# Patient Record
Sex: Female | Born: 1970 | Race: White | Hispanic: No | Smoking: Never smoker
Health system: Southern US, Community
[De-identification: ages and names within clinical notes are randomized; demographics above are authoritative.]

## PROBLEM LIST (undated history)

## (undated) DIAGNOSIS — D649 Anemia, unspecified: Secondary | ICD-10-CM

## (undated) DIAGNOSIS — N6310 Unspecified lump in the right breast, unspecified quadrant: Secondary | ICD-10-CM

## (undated) DIAGNOSIS — T7840XA Allergy, unspecified, initial encounter: Secondary | ICD-10-CM

## (undated) DIAGNOSIS — Z98811 Dental restoration status: Secondary | ICD-10-CM

## (undated) HISTORY — PX: BREAST BIOPSY: SHX20

## (undated) HISTORY — DX: Allergy, unspecified, initial encounter: T78.40XA

## (undated) HISTORY — PX: BREAST EXCISIONAL BIOPSY: SUR124

---

## 2004-06-20 ENCOUNTER — Other Ambulatory Visit: Admission: RE | Admit: 2004-06-20 | Discharge: 2004-06-20 | Payer: Self-pay | Admitting: Obstetrics and Gynecology

## 2006-11-22 ENCOUNTER — Inpatient Hospital Stay (HOSPITAL_COMMUNITY): Admission: AD | Admit: 2006-11-22 | Discharge: 2006-11-25 | Payer: Self-pay | Admitting: Obstetrics & Gynecology

## 2010-01-17 ENCOUNTER — Encounter: Admission: RE | Admit: 2010-01-17 | Discharge: 2010-01-17 | Payer: Self-pay | Admitting: Obstetrics & Gynecology

## 2010-03-31 ENCOUNTER — Encounter: Admission: RE | Admit: 2010-03-31 | Discharge: 2010-03-31 | Payer: Self-pay | Admitting: Obstetrics & Gynecology

## 2011-02-16 NOTE — Op Note (Signed)
NAME:  Cynthia Irwin, Cynthia Irwin NO.:  [id number]   MEDICAL RECORD NO.:  [id number]          PATIENT TYPE:  INP   LOCATION:  NA                            FACILITY:  [facility]   PHYSICIAN:  Gerrit Friends. Aldona Bar, M.D.   DATE OF BIRTH:  15-Aug-1971   DATE OF PROCEDURE:  11/22/2006  DATE OF DISCHARGE:                               OPERATIVE REPORT   PREOPERATIVE DIAGNOSIS:  Term pregnancy, previous cesarean section.   POSTOPERATIVE DIAGNOSES:  Term pregnancy, previous cesarean section,  plus delivery of viable female infant, Apgars 9 and 9, weight pending.   PROCEDURE:  Repeat low transverse cesarean section.   SURGEON:  Dr. Aldona Bar.   ASSISTANT:  Dr. Lodema Hong.   ANESTHESIA:  Subarachnoid block, Dr. Harvest Forest.   HISTORY:  This 40 year old gravida 3, para 1 has been followed by me in  my office during her pregnancy and done extremely well.  She now  presents at term for repeat cesarean section having had her first  cesarean section for failure to progress after going into spontaneous  labor and arresting at 5 cm.   The patient was taken to the operating room where a subarachnoid block  was carried out by Dr. Harvest Forest without difficulty.  Thereafter, the  patient was prepped and draped, having been placed in the supine  position, slightly tilted left, and a Foley catheter was placed as part  of the prep.   Once the patient was adequately draped and good anesthetic levels were  documented, procedure was begun.  A Pfannenstiel incision was made with  minimal difficulty, dissected down sharply to and through the fascia in  low transverse fashion with hemostasis created at each layer.  The  fascia was incised in a low transverse fashion.  Subfascial space was  created inferiorly and superiorly, muscles separated in the midline.  Peritoneum identified and entered appropriately with care taken to avoid  the bowel superiorly and the bladder inferiorly.  At this time, the  vesicouterine peritoneum  was identified, incised in a low transverse  fashion, and pushed off the lower uterine segment with ease.  Sharp  incision into the uterus in a low transverse fashion was then carried  out with Metzenbaum scissors.  Amniotomy produced - clear fluid, and the  incision was extended laterally.  Thereafter with the aid of the vacuum  extractor, a viable female infant which cried spontaneously at once was  delivered from the vertex position.  After the cord was clamped and cut,  the infant was passed off to the awaiting team.  Apgars were assigned at  9 and 9, and the weight is pending.   Placenta was delivered intact.  The patient was a cord blood donor.  At  this time, the uterus was exteriorized, rendered free of any remaining  products of conception, and closure of the uterus was then carried out  using a single layer of #1 Vicryl in a running locked fashion oversewn  by several figure-of-eight #1 Vicryls.  Good hemostasis was noted.  Tubes and ovaries appeared normal.  At this time, the uterus was  replaced in the abdominal cavity after the abdomen was lavaged of all  free blood and clot.  At this time, all counts were noted to be correct,  and no foreign bodies were noted to be remaining in the abdominal  cavity, and closure of the abdomen was carried out in layers.  The  abdominal peritoneum was closed with 0 Vicryl in a running fashion and  muscle secured with the same.  After assuring good vascular hemostasis,  the fascia was then reapproximated using 0 Vicryl from an angle in the  midline bilaterally.  Subcutaneous tissues were hemostatic.  The skin  was closed with staples.  Sterile pressure dressing was applied, and the  patient was transported to the recovery area in satisfactory condition,  having tolerated the procedure well.  Estimated blood loss 500 mL.  All  counts correct x2.   In summary, this patient underwent a repeat cesarean section at term  after a relatively benign  pregnancy and was delivered of a viable female  infant with Apgar of 9 and 9, weight pending.  At the conclusion of the  procedure, both mother and baby were doing well.      Gerrit Friends. Aldona Bar, M.D.  Electronically Signed     RMW/MEDQ  D:  11/22/2006  T:  11/22/2006  Job:  [id number]

## 2011-02-16 NOTE — Discharge Summary (Signed)
Cynthia Irwin, CLONCH NO.:  [id number]   MEDICAL RECORD NO.:  [id number]          PATIENT TYPE:  INP   LOCATION:  9118                          FACILITY:  [facility]   PHYSICIAN:  Gerrit Friends. Aldona Bar, M.D.   DATE OF BIRTH:  Jul 29, 1971   DATE OF ADMISSION:  11/22/2006  DATE OF DISCHARGE:  11/25/2006                               DISCHARGE SUMMARY   DISCHARGE DIAGNOSES:  1. Term pregnancy, delivered; 7 pound 9 ounce female infant, Apgars 9/9.  2. Previous cesarean section.  3. Blood type A+.   PROCEDURE:  Repeat low transverse cesarean section.  Surgeon - Dr. Aldona Bar.   SUMMARY:  This 40 year old gravida 3, para 1, was admitted at term for  an elective repeat cesarean section, having had her first baby by  cesarean section because of failure to progress.  Her pregnancy was  totally benign.  On the morning of November 22, 2006, she was taken to  the operating room, at which time she was delivered of a 7 pound 9 ounce  female infant with Apgars of 9/9 by repeat cesarean section.  Her  postoperative course was totally benign.  Discharge hemoglobin 9.3,  white count 8800, platelet count 132,000.  On the morning of November 25, 2006, she was ambulating well, tolerating a regular diet well,  having normal bowel and bladder function, was afebrile.  Her wound was  clean and dry.  Staples were removed, Steri-Strips left in place, and  she was given all appropriate instructions and understood all  instructions well and was discharged to home.   DISCHARGE MEDICATIONS:  1. Vitamins - one a day as long she is breast-feeding.  2. Feosol - one daily.  3. She will use Motrin 600 mg every 6 hours as needed for cramping or      pain.  4. Tylox 1-2 every 4-6 hours as needed for more severe pain.   FOLLOWUP:  She will return to the office for follow-up in approximately  3-4 weeks time or as needed.   CONDITION ON DISCHARGE:  Improved.      Gerrit Friends. Aldona Bar, M.D.  Electronically  Signed     RMW/MEDQ  D:  11/25/2006  T:  11/25/2006  Job:  [id number]

## 2012-04-15 ENCOUNTER — Other Ambulatory Visit: Payer: Self-pay | Admitting: Obstetrics & Gynecology

## 2013-08-10 ENCOUNTER — Other Ambulatory Visit: Payer: Self-pay | Admitting: Obstetrics & Gynecology

## 2013-08-18 ENCOUNTER — Other Ambulatory Visit: Payer: Self-pay | Admitting: Obstetrics & Gynecology

## 2013-08-18 DIAGNOSIS — R928 Other abnormal and inconclusive findings on diagnostic imaging of breast: Secondary | ICD-10-CM

## 2013-09-07 ENCOUNTER — Ambulatory Visit
Admission: RE | Admit: 2013-09-07 | Discharge: 2013-09-07 | Disposition: A | Discharge status: Home / Self Care | Payer: BC Managed Care – PPO | Source: Ambulatory Visit | Attending: Obstetrics & Gynecology | Admitting: Obstetrics & Gynecology

## 2013-09-07 ENCOUNTER — Other Ambulatory Visit: Payer: Self-pay | Admitting: Obstetrics & Gynecology

## 2013-09-07 DIAGNOSIS — R928 Other abnormal and inconclusive findings on diagnostic imaging of breast: Secondary | ICD-10-CM

## 2013-09-09 ENCOUNTER — Other Ambulatory Visit: Payer: Self-pay | Admitting: Obstetrics & Gynecology

## 2013-09-09 ENCOUNTER — Ambulatory Visit
Admission: RE | Admit: 2013-09-09 | Discharge: 2013-09-09 | Disposition: A | Discharge status: Home / Self Care | Payer: BC Managed Care – PPO | Source: Ambulatory Visit | Attending: Obstetrics & Gynecology | Admitting: Obstetrics & Gynecology

## 2013-09-09 ENCOUNTER — Other Ambulatory Visit (HOSPITAL_COMMUNITY): Payer: Self-pay | Admitting: Diagnostic Radiology

## 2013-09-09 DIAGNOSIS — R928 Other abnormal and inconclusive findings on diagnostic imaging of breast: Secondary | ICD-10-CM

## 2014-09-27 ENCOUNTER — Other Ambulatory Visit: Payer: Self-pay | Admitting: Obstetrics & Gynecology

## 2014-09-27 DIAGNOSIS — N644 Mastodynia: Secondary | ICD-10-CM

## 2014-09-28 LAB — CYTOLOGY - PAP

## 2014-10-19 ENCOUNTER — Other Ambulatory Visit: Payer: Self-pay | Admitting: Obstetrics & Gynecology

## 2014-10-19 ENCOUNTER — Ambulatory Visit
Admission: RE | Admit: 2014-10-19 | Discharge: 2014-10-19 | Disposition: A | Discharge status: Home / Self Care | Payer: BC Managed Care – PPO | Source: Ambulatory Visit | Attending: Obstetrics & Gynecology | Admitting: Obstetrics & Gynecology

## 2014-10-19 DIAGNOSIS — N644 Mastodynia: Secondary | ICD-10-CM

## 2014-10-26 ENCOUNTER — Other Ambulatory Visit: Payer: Self-pay | Admitting: Obstetrics & Gynecology

## 2014-10-26 DIAGNOSIS — N644 Mastodynia: Secondary | ICD-10-CM

## 2014-11-01 ENCOUNTER — Other Ambulatory Visit: Payer: BC Managed Care – PPO

## 2014-11-10 ENCOUNTER — Ambulatory Visit
Admission: RE | Admit: 2014-11-10 | Discharge: 2014-11-10 | Disposition: A | Discharge status: Home / Self Care | Payer: BC Managed Care – PPO | Source: Ambulatory Visit | Attending: Obstetrics & Gynecology | Admitting: Obstetrics & Gynecology

## 2014-11-10 DIAGNOSIS — N644 Mastodynia: Secondary | ICD-10-CM

## 2015-09-19 ENCOUNTER — Other Ambulatory Visit: Payer: Self-pay

## 2015-09-19 DIAGNOSIS — Z1231 Encounter for screening mammogram for malignant neoplasm of breast: Secondary | ICD-10-CM

## 2015-10-26 ENCOUNTER — Ambulatory Visit
Admission: RE | Admit: 2015-10-26 | Discharge: 2015-10-26 | Disposition: A | Discharge status: Home / Self Care | Payer: BC Managed Care – PPO | Source: Ambulatory Visit

## 2015-10-26 DIAGNOSIS — Z1231 Encounter for screening mammogram for malignant neoplasm of breast: Secondary | ICD-10-CM

## 2016-09-17 ENCOUNTER — Other Ambulatory Visit: Payer: Self-pay | Admitting: Obstetrics & Gynecology

## 2016-09-18 LAB — CYTOLOGY - PAP

## 2016-10-01 HISTORY — PX: BREAST LUMPECTOMY: SHX2

## 2016-11-09 ENCOUNTER — Other Ambulatory Visit: Payer: Self-pay | Admitting: Obstetrics & Gynecology

## 2016-11-09 DIAGNOSIS — Z1231 Encounter for screening mammogram for malignant neoplasm of breast: Secondary | ICD-10-CM

## 2016-11-23 ENCOUNTER — Ambulatory Visit
Admission: RE | Admit: 2016-11-23 | Discharge: 2016-11-23 | Disposition: A | Discharge status: Home / Self Care | Payer: BC Managed Care – PPO | Source: Ambulatory Visit | Attending: Obstetrics & Gynecology | Admitting: Obstetrics & Gynecology

## 2016-11-23 DIAGNOSIS — Z1231 Encounter for screening mammogram for malignant neoplasm of breast: Secondary | ICD-10-CM

## 2016-11-27 ENCOUNTER — Other Ambulatory Visit: Payer: Self-pay | Admitting: Obstetrics & Gynecology

## 2016-11-27 DIAGNOSIS — R928 Other abnormal and inconclusive findings on diagnostic imaging of breast: Secondary | ICD-10-CM

## 2016-12-07 ENCOUNTER — Other Ambulatory Visit: Payer: Self-pay | Admitting: Obstetrics & Gynecology

## 2016-12-07 ENCOUNTER — Ambulatory Visit
Admission: RE | Admit: 2016-12-07 | Discharge: 2016-12-07 | Disposition: A | Discharge status: Home / Self Care | Payer: BC Managed Care – PPO | Source: Ambulatory Visit | Attending: Obstetrics & Gynecology | Admitting: Obstetrics & Gynecology

## 2016-12-07 DIAGNOSIS — R928 Other abnormal and inconclusive findings on diagnostic imaging of breast: Secondary | ICD-10-CM

## 2016-12-07 DIAGNOSIS — N6310 Unspecified lump in the right breast, unspecified quadrant: Secondary | ICD-10-CM

## 2016-12-12 ENCOUNTER — Ambulatory Visit
Admission: RE | Admit: 2016-12-12 | Discharge: 2016-12-12 | Disposition: A | Discharge status: Home / Self Care | Payer: BC Managed Care – PPO | Source: Ambulatory Visit | Attending: Obstetrics & Gynecology | Admitting: Obstetrics & Gynecology

## 2016-12-12 ENCOUNTER — Other Ambulatory Visit: Payer: Self-pay | Admitting: Obstetrics & Gynecology

## 2016-12-12 DIAGNOSIS — N6310 Unspecified lump in the right breast, unspecified quadrant: Secondary | ICD-10-CM

## 2016-12-27 ENCOUNTER — Other Ambulatory Visit: Payer: Self-pay | Admitting: General Surgery

## 2016-12-27 DIAGNOSIS — N6310 Unspecified lump in the right breast, unspecified quadrant: Secondary | ICD-10-CM

## 2017-01-21 ENCOUNTER — Other Ambulatory Visit: Payer: Self-pay | Admitting: General Surgery

## 2017-01-21 DIAGNOSIS — N6310 Unspecified lump in the right breast, unspecified quadrant: Secondary | ICD-10-CM

## 2017-01-29 DIAGNOSIS — N6310 Unspecified lump in the right breast, unspecified quadrant: Secondary | ICD-10-CM

## 2017-01-29 HISTORY — DX: Unspecified lump in the right breast, unspecified quadrant: N63.10

## 2017-01-30 ENCOUNTER — Encounter (HOSPITAL_BASED_OUTPATIENT_CLINIC_OR_DEPARTMENT_OTHER): Payer: Self-pay | Admitting: *Deleted

## 2017-01-30 NOTE — Pre-Procedure Instructions (Signed)
To come pick up Boost Breeze - to drink by [phone]

## 2017-02-05 ENCOUNTER — Other Ambulatory Visit: Payer: Self-pay | Admitting: General Surgery

## 2017-02-05 ENCOUNTER — Ambulatory Visit
Admission: RE | Admit: 2017-02-05 | Discharge: 2017-02-05 | Disposition: A | Discharge status: Home / Self Care | Payer: BC Managed Care – PPO | Source: Ambulatory Visit | Attending: General Surgery | Admitting: General Surgery

## 2017-02-05 DIAGNOSIS — N6310 Unspecified lump in the right breast, unspecified quadrant: Secondary | ICD-10-CM

## 2017-02-05 NOTE — Progress Notes (Signed)
Boost drink given with instructions to complete by [facility], pt verbalized understanding.

## 2017-02-06 ENCOUNTER — Encounter (HOSPITAL_BASED_OUTPATIENT_CLINIC_OR_DEPARTMENT_OTHER)
Admission: RE | Disposition: A | Discharge status: Home / Self Care | Payer: Self-pay | Source: Ambulatory Visit | Attending: General Surgery

## 2017-02-06 ENCOUNTER — Ambulatory Visit (HOSPITAL_BASED_OUTPATIENT_CLINIC_OR_DEPARTMENT_OTHER)
Admission: RE | Admit: 2017-02-06 | Discharge: 2017-02-06 | Disposition: A | Discharge status: Home / Self Care | Payer: BC Managed Care – PPO | Source: Ambulatory Visit | Attending: General Surgery | Admitting: General Surgery

## 2017-02-06 ENCOUNTER — Encounter (HOSPITAL_BASED_OUTPATIENT_CLINIC_OR_DEPARTMENT_OTHER): Payer: Self-pay | Admitting: Anesthesiology

## 2017-02-06 ENCOUNTER — Ambulatory Visit (HOSPITAL_BASED_OUTPATIENT_CLINIC_OR_DEPARTMENT_OTHER): Payer: BC Managed Care – PPO | Admitting: Anesthesiology

## 2017-02-06 ENCOUNTER — Ambulatory Visit
Admission: RE | Admit: 2017-02-06 | Discharge: 2017-02-06 | Disposition: A | Discharge status: Home / Self Care | Payer: BC Managed Care – PPO | Source: Ambulatory Visit | Attending: General Surgery | Admitting: General Surgery

## 2017-02-06 DIAGNOSIS — N6091 Unspecified benign mammary dysplasia of right breast: Secondary | ICD-10-CM | POA: Diagnosis not present

## 2017-02-06 DIAGNOSIS — D241 Benign neoplasm of right breast: Secondary | ICD-10-CM | POA: Diagnosis not present

## 2017-02-06 DIAGNOSIS — Z79899 Other long term (current) drug therapy: Secondary | ICD-10-CM | POA: Diagnosis not present

## 2017-02-06 DIAGNOSIS — N6310 Unspecified lump in the right breast, unspecified quadrant: Secondary | ICD-10-CM

## 2017-02-06 HISTORY — DX: Anemia, unspecified: D64.9

## 2017-02-06 HISTORY — DX: Unspecified lump in the right breast, unspecified quadrant: N63.10

## 2017-02-06 HISTORY — PX: RADIOACTIVE SEED GUIDED EXCISIONAL BREAST BIOPSY: SHX6490

## 2017-02-06 HISTORY — DX: Dental restoration status: Z98.811

## 2017-02-06 LAB — POCT HEMOGLOBIN-HEMACUE: HEMOGLOBIN: 11.9 g/dL — AB (ref 12.0–15.0)

## 2017-02-06 SURGERY — RADIOACTIVE SEED GUIDED BREAST BIOPSY
Anesthesia: General | Site: Breast | Laterality: Right

## 2017-02-06 MED ORDER — ONDANSETRON HCL 4 MG/2ML IJ SOLN
INTRAMUSCULAR | Status: AC
  Filled 2017-02-06: qty 2

## 2017-02-06 MED ORDER — CEFAZOLIN SODIUM-DEXTROSE 2-4 GM/100ML-% IV SOLN
2.0000 g | INTRAVENOUS | Status: AC
  Administered 2017-02-06: 2 g via INTRAVENOUS

## 2017-02-06 MED ORDER — BUPIVACAINE HCL (PF) 0.25 % IJ SOLN
INTRAMUSCULAR | Status: DC | PRN
  Administered 2017-02-06: 10 mL

## 2017-02-06 MED ORDER — MIDAZOLAM HCL 5 MG/5ML IJ SOLN
INTRAMUSCULAR | Status: DC | PRN
  Administered 2017-02-06: 2 mg via INTRAVENOUS

## 2017-02-06 MED ORDER — ONDANSETRON HCL 4 MG/2ML IJ SOLN
INTRAMUSCULAR | Status: DC | PRN
  Administered 2017-02-06: 4 mg via INTRAVENOUS

## 2017-02-06 MED ORDER — MIDAZOLAM HCL 2 MG/2ML IJ SOLN: 1.0000 mg | INTRAMUSCULAR | Status: DC | PRN

## 2017-02-06 MED ORDER — ACETAMINOPHEN 500 MG PO TABS
1000.0000 mg | ORAL_TABLET | ORAL | Status: AC
  Administered 2017-02-06: 1000 mg via ORAL

## 2017-02-06 MED ORDER — HYDROMORPHONE HCL 1 MG/ML IJ SOLN: 0.2500 mg | INTRAMUSCULAR | Status: DC | PRN

## 2017-02-06 MED ORDER — MIDAZOLAM HCL 2 MG/2ML IJ SOLN
INTRAMUSCULAR | Status: AC
  Filled 2017-02-06: qty 2

## 2017-02-06 MED ORDER — CELECOXIB 200 MG PO CAPS
200.0000 mg | ORAL_CAPSULE | ORAL | Status: AC
  Administered 2017-02-06: 200 mg via ORAL

## 2017-02-06 MED ORDER — EPHEDRINE SULFATE 50 MG/ML IJ SOLN
INTRAMUSCULAR | Status: DC | PRN
  Administered 2017-02-06: 10 mg via INTRAVENOUS

## 2017-02-06 MED ORDER — PROPOFOL 10 MG/ML IV BOLUS
INTRAVENOUS | Status: DC | PRN
  Administered 2017-02-06: 150 mg via INTRAVENOUS

## 2017-02-06 MED ORDER — FENTANYL CITRATE (PF) 100 MCG/2ML IJ SOLN
INTRAMUSCULAR | Status: DC | PRN
  Administered 2017-02-06: 100 ug via INTRAVENOUS

## 2017-02-06 MED ORDER — SCOPOLAMINE 1 MG/3DAYS TD PT72: 1.0000 | MEDICATED_PATCH | Freq: Once | TRANSDERMAL | Status: DC | PRN

## 2017-02-06 MED ORDER — LIDOCAINE HCL (CARDIAC) 20 MG/ML IV SOLN
INTRAVENOUS | Status: DC | PRN
  Administered 2017-02-06: 30 mg via INTRAVENOUS

## 2017-02-06 MED ORDER — LIDOCAINE 2% (20 MG/ML) 5 ML SYRINGE
INTRAMUSCULAR | Status: AC
Start: 2017-02-06 — End: 2017-02-06
  Filled 2017-02-06: qty 5

## 2017-02-06 MED ORDER — DEXAMETHASONE SODIUM PHOSPHATE 10 MG/ML IJ SOLN
INTRAMUSCULAR | Status: AC
Start: 2017-02-06 — End: 2017-02-06
  Filled 2017-02-06: qty 1

## 2017-02-06 MED ORDER — GABAPENTIN 300 MG PO CAPS
ORAL_CAPSULE | ORAL | Status: AC
Start: 2017-02-06 — End: 2017-02-06
  Filled 2017-02-06: qty 1

## 2017-02-06 MED ORDER — ACETAMINOPHEN 500 MG PO TABS
ORAL_TABLET | ORAL | Status: AC
  Filled 2017-02-06: qty 2

## 2017-02-06 MED ORDER — CELECOXIB 200 MG PO CAPS
ORAL_CAPSULE | ORAL | Status: AC
  Filled 2017-02-06: qty 2

## 2017-02-06 MED ORDER — HYDROCODONE-ACETAMINOPHEN 5-325 MG PO TABS: 1.0000 | ORAL_TABLET | Freq: Four times a day (QID) | ORAL | 0 refills | Status: DC | PRN

## 2017-02-06 MED ORDER — DEXAMETHASONE SODIUM PHOSPHATE 4 MG/ML IJ SOLN
INTRAMUSCULAR | Status: DC | PRN
  Administered 2017-02-06: 10 mg via INTRAVENOUS

## 2017-02-06 MED ORDER — FENTANYL CITRATE (PF) 100 MCG/2ML IJ SOLN
INTRAMUSCULAR | Status: AC
  Filled 2017-02-06: qty 2

## 2017-02-06 MED ORDER — FENTANYL CITRATE (PF) 100 MCG/2ML IJ SOLN: 50.0000 ug | INTRAMUSCULAR | Status: DC | PRN

## 2017-02-06 MED ORDER — LACTATED RINGERS IV SOLN
INTRAVENOUS | Status: DC
  Administered 2017-02-06: 10:00:00 via INTRAVENOUS

## 2017-02-06 MED ORDER — PHENYLEPHRINE HCL 10 MG/ML IJ SOLN
INTRAMUSCULAR | Status: DC | PRN
  Administered 2017-02-06: 80 ug via INTRAVENOUS

## 2017-02-06 MED ORDER — CEFAZOLIN SODIUM-DEXTROSE 2-4 GM/100ML-% IV SOLN
INTRAVENOUS | Status: AC
  Filled 2017-02-06: qty 100

## 2017-02-06 MED ORDER — PROPOFOL 10 MG/ML IV BOLUS
INTRAVENOUS | Status: AC
  Filled 2017-02-06: qty 20

## 2017-02-06 MED ORDER — GABAPENTIN 300 MG PO CAPS
300.0000 mg | ORAL_CAPSULE | ORAL | Status: AC
  Administered 2017-02-06: 300 mg via ORAL

## 2017-02-06 SURGICAL SUPPLY — 58 items
ADH SKN CLS APL DERMABOND .7 (GAUZE/BANDAGES/DRESSINGS) ×1
BINDER BREAST LRG (GAUZE/BANDAGES/DRESSINGS) ×3 IMPLANT
BINDER BREAST MEDIUM (GAUZE/BANDAGES/DRESSINGS) IMPLANT
BINDER BREAST XLRG (GAUZE/BANDAGES/DRESSINGS) IMPLANT
BINDER BREAST XXLRG (GAUZE/BANDAGES/DRESSINGS) IMPLANT
BLADE SURG 15 STRL LF DISP TIS (BLADE) ×1 IMPLANT
BLADE SURG 15 STRL SS (BLADE) ×3
CANISTER SUC SOCK COL 7IN (MISCELLANEOUS) IMPLANT
CANISTER SUCT 1200ML W/VALVE (MISCELLANEOUS) IMPLANT
CHLORAPREP W/TINT 26ML (MISCELLANEOUS) ×3 IMPLANT
CLIP TI WIDE RED SMALL 6 (CLIP) ×3 IMPLANT
CLOSURE WOUND 1/2 X4 (GAUZE/BANDAGES/DRESSINGS) ×1
COVER BACK TABLE 60X90IN (DRAPES) ×3 IMPLANT
COVER MAYO STAND STRL (DRAPES) ×3 IMPLANT
COVER PROBE W GEL 5X96 (DRAPES) ×3 IMPLANT
DECANTER SPIKE VIAL GLASS SM (MISCELLANEOUS) IMPLANT
DERMABOND ADVANCED (GAUZE/BANDAGES/DRESSINGS) ×2
DERMABOND ADVANCED .7 DNX12 (GAUZE/BANDAGES/DRESSINGS) ×1 IMPLANT
DEVICE DUBIN W/COMP PLATE 8390 (MISCELLANEOUS) ×3 IMPLANT
DRAPE LAPAROSCOPIC ABDOMINAL (DRAPES) ×3 IMPLANT
DRAPE UTILITY XL STRL (DRAPES) ×3 IMPLANT
DRSG TEGADERM 4X4.75 (GAUZE/BANDAGES/DRESSINGS) IMPLANT
ELECT COATED BLADE 2.86 ST (ELECTRODE) ×3 IMPLANT
ELECT REM PT RETURN 9FT ADLT (ELECTROSURGICAL) ×3
ELECTRODE REM PT RTRN 9FT ADLT (ELECTROSURGICAL) ×1 IMPLANT
GAUZE SPONGE 4X4 12PLY STRL LF (GAUZE/BANDAGES/DRESSINGS) IMPLANT
GLOVE BIO SURGEON STRL SZ7 (GLOVE) ×6 IMPLANT
GLOVE BIOGEL PI IND STRL 7.5 (GLOVE) ×1 IMPLANT
GLOVE BIOGEL PI INDICATOR 7.5 (GLOVE) ×6
GLOVE SURG SYN 7.5  E (GLOVE) ×2
GLOVE SURG SYN 7.5 E (GLOVE) ×1 IMPLANT
GOWN STRL REUS W/ TWL LRG LVL3 (GOWN DISPOSABLE) ×2 IMPLANT
GOWN STRL REUS W/TWL LRG LVL3 (GOWN DISPOSABLE) ×6
HEMOSTAT ARISTA ABSORB 3G PWDR (MISCELLANEOUS) ×2 IMPLANT
ILLUMINATOR WAVEGUIDE N/F (MISCELLANEOUS) ×3 IMPLANT
KIT MARKER MARGIN INK (KITS) ×3 IMPLANT
LIGHT WAVEGUIDE WIDE FLAT (MISCELLANEOUS) IMPLANT
NEEDLE HYPO 25X1 1.5 SAFETY (NEEDLE) ×3 IMPLANT
NS IRRIG 1000ML POUR BTL (IV SOLUTION) IMPLANT
PACK BASIN DAY SURGERY FS (CUSTOM PROCEDURE TRAY) ×3 IMPLANT
PENCIL BUTTON HOLSTER BLD 10FT (ELECTRODE) ×3 IMPLANT
SLEEVE SCD COMPRESS KNEE MED (MISCELLANEOUS) ×3 IMPLANT
SPONGE LAP 4X18 X RAY DECT (DISPOSABLE) ×3 IMPLANT
STRIP CLOSURE SKIN 1/2X4 (GAUZE/BANDAGES/DRESSINGS) ×2 IMPLANT
SUT MNCRL AB 4-0 PS2 18 (SUTURE) IMPLANT
SUT MON AB 5-0 PS2 18 (SUTURE) ×3 IMPLANT
SUT SILK 2 0 SH (SUTURE) IMPLANT
SUT VIC AB 2-0 SH 27 (SUTURE) ×3
SUT VIC AB 2-0 SH 27XBRD (SUTURE) ×1 IMPLANT
SUT VIC AB 3-0 SH 27 (SUTURE) ×3
SUT VIC AB 3-0 SH 27X BRD (SUTURE) ×1 IMPLANT
SUT VIC AB 5-0 PS2 18 (SUTURE) IMPLANT
SYR CONTROL 10ML LL (SYRINGE) ×3 IMPLANT
TOWEL OR 17X24 6PK STRL BLUE (TOWEL DISPOSABLE) ×3 IMPLANT
TOWEL OR NON WOVEN STRL DISP B (DISPOSABLE) ×3 IMPLANT
TUBE CONNECTING 20'X1/4 (TUBING)
TUBE CONNECTING 20X1/4 (TUBING) IMPLANT
YANKAUER SUCT BULB TIP NO VENT (SUCTIONS) IMPLANT

## 2017-02-06 NOTE — Anesthesia Postprocedure Evaluation (Signed)
Anesthesia Post Note  Patient: Cynthia Irwin  Procedure(s) Performed: Procedure(s) (LRB): RADIOACTIVE SEED GUIDED EXCISIONAL BREAST BIOPSY (Right)  Patient location during evaluation: PACU Anesthesia Type: General Level of consciousness: awake and alert Pain management: pain level controlled Vital Signs Assessment: post-procedure vital signs reviewed and stable Respiratory status: spontaneous breathing, nonlabored ventilation and respiratory function stable Cardiovascular status: blood pressure returned to baseline and stable Postop Assessment: no signs of nausea or vomiting Anesthetic complications: no       Last Vitals:  Vitals:   02/06/17 1145 02/06/17 1200  BP: 121/66 114/70  Pulse: 81 72  Resp: (!) 27 15  Temp:      Last Pain:  Vitals:   02/06/17 1200  TempSrc:   PainSc: 2                  Abdirizak Richison,W. EDMOND

## 2017-02-06 NOTE — Interval H&P Note (Signed)
History and Physical Interval Note:  02/06/2017 9:55 AM  Cynthia Irwin  has presented today for surgery, with the diagnosis of RIGHT BREAST MASS  The various methods of treatment have been discussed with the patient and family. After consideration of risks, benefits and other options for treatment, the patient has consented to  Procedure(s): RADIOACTIVE SEED GUIDED EXCISIONAL BREAST BIOPSY (Right) as a surgical intervention .  The patient's history has been reviewed, patient examined, no change in status, stable for surgery.  I have reviewed the patient's chart and labs.  Questions were answered to the patient's satisfaction.     Marv Alfrey

## 2017-02-06 NOTE — Op Note (Signed)
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 Serita Grammes Anesthesia: Gen. 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 We discussed options and elected to proceed with seed guided excisional biopsy. She has seed placed prior to beginning and I had these mammograms in the OR  Procedure: After informed consent was obtained she was then taken to the operating room. She was given cefazolin. Sequential compression devices were on her legs. She was placed under general anesthesia without complication. Her rightbreast was then prepped and draped in the standard sterile surgical fashion. A surgical timeout was then performed.  I located the radioactive seed with the neoprobe. I infiltrated marcaine in the area of the seed. I made a far lateral incision to hide the scar.  I then used the neoprobe to guide the excision of the seed and surrounding tissue.This was confirmed by the neoprobe. This was then taken for mammogram which confirmed removal of the seed and the clip. The posterior extent is the muscle. 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 This was confirmed by radiology. This was then sent to pathology. Hemostasis was observed.I closed the breast tissue with a 2-0 Vicryl. The dermis was closed with 3-0 Vicryl and the skin with 4-0 Monocryl.Dermabond and steristrips were placed on the incision. A breast binder was placed. She was transferred to recovery stable

## 2017-02-06 NOTE — Discharge Instructions (Signed)
[facility] °Office Phone Number [phone] ° °POST OP INSTRUCTIONS ° °Always review your discharge instruction sheet given to you by the facility where your surgery was performed. ° °IF YOU HAVE DISABILITY OR FAMILY LEAVE FORMS, YOU MUST BRING THEM TO THE OFFICE FOR PROCESSING.  DO NOT GIVE THEM TO YOUR DOCTOR. ° °1. A prescription for pain medication may be given to you upon discharge.  Take your pain medication as prescribed, if needed.  If narcotic pain medicine is not needed, then you may take acetaminophen (Tylenol), naprosyn (Alleve) or ibuprofen (Advil) as needed. °2. Take your usually prescribed medications unless otherwise directed °3. If you need a refill on your pain medication, please contact your pharmacy.  They will contact our office to request authorization.  Prescriptions will not be filled after 5pm or on week-ends. °4. You should eat very light the first 24 hours after surgery, such as soup, crackers, pudding, etc.  Resume your normal diet the day after surgery. °5. Most patients will experience some swelling and bruising in the breast.  Ice packs and a good support bra will help.  Wear the breast binder provided or a sports bra for 72 hours day and night.  After that wear a sports bra during the day until you return to the office. Swelling and bruising can take several days to resolve.  °6. It is common to experience some constipation if taking pain medication after surgery.  Increasing fluid intake and taking a stool softener will usually help or prevent this problem from occurring.  A mild laxative (Milk of Magnesia or Miralax) should be taken according to package directions if there are no bowel movements after 48 hours. °7. Unless discharge instructions indicate otherwise, you may remove your bandages 48 hours after surgery and you may shower at that time.  You may have steri-strips (small skin tapes) in place directly over the incision.  These strips should be left on the  skin for 7-10 days and will come off on their own.  If your surgeon used skin glue on the incision, you may shower in 24 hours.  The glue will flake off over the next 2-3 weeks.  Any sutures or staples will be removed at the office during your follow-up visit. °8. ACTIVITIES:  You may resume regular daily activities (gradually increasing) beginning the next day.  Wearing a good support bra or sports bra minimizes pain and swelling.  You may have sexual intercourse when it is comfortable. °a. You may drive when you no longer are taking prescription pain medication, you can comfortably wear a seatbelt, and you can safely maneuver your car and apply brakes. °b. RETURN TO WORK:  ______________________________________________________________________________________ °9. You should see your doctor in the office for a follow-up appointment approximately two weeks after your surgery.  Your doctor’s nurse will typically make your follow-up appointment when she calls you with your pathology report.  Expect your pathology report 3-4 business days after your surgery.  You may call to check if you do not hear from us after three days. °10. OTHER INSTRUCTIONS: _______________________________________________________________________________________________ _____________________________________________________________________________________________________________________________________ °_____________________________________________________________________________________________________________________________________ °_____________________________________________________________________________________________________________________________________ ° °WHEN TO CALL DR WAKEFIELD: °1. Fever over 101.0 °2. Nausea and/or vomiting. °3. Extreme swelling or bruising. °4. Continued bleeding from incision. °5. Increased pain, redness, or drainage from the incision. ° °The clinic staff is available to answer your questions during regular  business hours.  Please don’t hesitate to call and ask to speak to one of the nurses for clinical concerns.  If   you have a medical emergency, go to the nearest emergency room or call 911.  A surgeon from [facility] is always on call at the hospital. ° °For further questions, please visit [email or web address] ° ° ° ° ° °Post Anesthesia Home Care Instructions ° °Activity: °Get plenty of rest for the remainder of the day. A responsible individual must stay with you for 24 hours following the procedure.  °For the next 24 hours, DO NOT: °-Drive a car °-Operate machinery °-Drink alcoholic beverages °-Take any medication unless instructed by your physician °-Make any legal decisions or sign important papers. ° °Meals: °Start with liquid foods such as gelatin or soup. Progress to regular foods as tolerated. Avoid greasy, spicy, heavy foods. If nausea and/or vomiting occur, drink only clear liquids until the nausea and/or vomiting subsides. Call your physician if vomiting continues. ° °Special Instructions/Symptoms: °Your throat may feel dry or sore from the anesthesia or the breathing tube placed in your throat during surgery. If this causes discomfort, gargle with warm salt water. The discomfort should disappear within 24 hours. ° °If you had a scopolamine patch placed behind your ear for the management of post- operative nausea and/or vomiting: ° °1. The medication in the patch is effective for 72 hours, after which it should be removed.  Wrap patch in a tissue and discard in the trash. Wash hands thoroughly with soap and water. °2. You may remove the patch earlier than 72 hours if you experience unpleasant side effects which may include dry mouth, dizziness or visual disturbances. °3. Avoid touching the patch. Wash your hands with soap and water after contact with the patch. °  ° °

## 2017-02-06 NOTE — Anesthesia Preprocedure Evaluation (Addendum)
Anesthesia Evaluation  Patient identified by MRN, date of birth, ID band Patient awake    Reviewed: Allergy & Precautions, H&P , NPO status , Patient's Chart, lab work & pertinent test results  Airway Mallampati: I  TM Distance: >3 FB Neck ROM: Full    Dental no notable dental hx. (+) Teeth Intact, Dental Advisory Given   Pulmonary neg pulmonary ROS,    Pulmonary exam normal breath sounds clear to auscultation       Cardiovascular negative cardio ROS   Rhythm:Regular Rate:Normal     Neuro/Psych negative neurological ROS  negative psych ROS   GI/Hepatic negative GI ROS, Neg liver ROS,   Endo/Other  negative endocrine ROS  Renal/GU negative Renal ROS  negative genitourinary   Musculoskeletal   Abdominal   Peds  Hematology negative hematology ROS (+)   Anesthesia Other Findings   Reproductive/Obstetrics negative OB ROS                            Anesthesia Physical Anesthesia Plan  ASA: I  Anesthesia Plan: General   Post-op Pain Management:    Induction: Intravenous  Airway Management Planned: LMA  Additional Equipment:   Intra-op Plan:   Post-operative Plan: Extubation in OR  Informed Consent: I have reviewed the patients History and Physical, chart, labs and discussed the procedure including the risks, benefits and alternatives for the proposed anesthesia with the patient or authorized representative who has indicated his/her understanding and acceptance.   Dental advisory given  Plan Discussed with: CRNA  Anesthesia Plan Comments:         Anesthesia Quick Evaluation

## 2017-02-06 NOTE — Transfer of Care (Signed)
Immediate Anesthesia Transfer of Care Note  Patient: Cynthia Irwin  Procedure(s) Performed: Procedure(s): RADIOACTIVE SEED GUIDED EXCISIONAL BREAST BIOPSY (Right)  Patient Location: PACU  Anesthesia Type:General  Level of Consciousness: awake  Airway & Oxygen Therapy: Patient Spontanous Breathing and Patient connected to face mask oxygen  Post-op Assessment: Report given to RN and Post -op Vital signs reviewed and stable  Post vital signs: Reviewed and stable  Last Vitals:  Vitals:   02/06/17 0923  BP: (!) 101/57  Pulse: 68  Resp: 18  Temp: 36.8 C    Last Pain:  Vitals:   02/06/17 0923  TempSrc: Oral         Complications: No apparent anesthesia complications

## 2017-02-06 NOTE — Progress Notes (Signed)
Patient ID: Cynthia Irwin, female   DOB: Mar 02, 1971, 46 y.o.   MRN: [id number] [place] reviewed day of surgery, postop pain rx given

## 2017-02-06 NOTE — H&P (Signed)
46 yof referred by Dr Fidela Salisbury for newly diagnosed right breast mass. no family history breast cancer she has personal history of multiple FA that underwent core biopsy. she underwent screening mm that shows right breast mass. on [place] there is a 1.7x1x1.5 cm mass with small superior projection. core biopsy is biphasic lesion cannot rule out low grade phyllodes tumor. she is here with her husband to discuss options  Past Surgical History Breast Biopsy  multiple Cesarean Section - Multiple   Diagnostic Studies History  Colonoscopy  never Mammogram  within last year Pap Smear  1-5 years ago  Allergies  No Known Allergies 12/27/2016  Medication History  Sertraline HCl (50MG  Tablet, Oral) Active. Medications Reconciled  Social History Alcohol use  Occasional alcohol use. Caffeine use  Coffee. Illicit drug use  Remotely quit drug use. Tobacco use  Never smoker.  Family History Heart Disease  Father. Heart disease in female family member before age 34  Heart disease in female family member before age 71  Hypertension  Father.  Pregnancy / Birth History Age at menarche  22 years. Contraceptive History  Oral contraceptives. Gravida  2 Length (months) of breastfeeding  12-24 Maternal age  15-30 Para  2 Regular periods   Other Problems  Back Pain  Lump In Breast    Review of Systems  General Not Present- Appetite Loss, Chills, Fatigue, Fever, Night Sweats, Weight Gain and Weight Loss. Skin Not Present- Change in Wart/Mole, Dryness, Hives, Jaundice, New Lesions, Non-Healing Wounds, Rash and Ulcer. HEENT Present- Seasonal Allergies and Wears glasses/contact lenses. Not Present- Earache, Hearing Loss, Hoarseness, Nose Bleed, Oral Ulcers, Ringing in the Ears, Sinus Pain, Sore Throat, Visual Disturbances and Yellow Eyes. Respiratory Not Present- Bloody sputum, Chronic Cough, Difficulty Breathing, Snoring and Wheezing. Breast Present- Breast Mass. Not Present-  Breast Pain, Nipple Discharge and Skin Changes. Cardiovascular Not Present- Chest Pain, Difficulty Breathing Lying Down, Leg Cramps, Palpitations, Rapid Heart Rate, Shortness of Breath and Swelling of Extremities. Gastrointestinal Not Present- Abdominal Pain, Bloating, Bloody Stool, Change in Bowel Habits, Chronic diarrhea, Constipation, Difficulty Swallowing, Excessive gas, Gets full quickly at meals, Hemorrhoids, Indigestion, Nausea, Rectal Pain and Vomiting. Female Genitourinary Not Present- Frequency, Nocturia, Painful Urination, Pelvic Pain and Urgency. Musculoskeletal Not Present- Back Pain, Joint Pain, Joint Stiffness, Muscle Pain, Muscle Weakness and Swelling of Extremities. Neurological Not Present- Decreased Memory, Fainting, Headaches, Numbness, Seizures, Tingling, Tremor, Trouble walking and Weakness. Psychiatric Not Present- Anxiety, Bipolar, Change in Sleep Pattern, Depression, Fearful and Frequent crying. Endocrine Not Present- Cold Intolerance, Excessive Hunger, Hair Changes, Heat Intolerance, Hot flashes and New Diabetes. Hematology Not Present- Blood Thinners, Easy Bruising, Excessive bleeding, Gland problems, HIV and Persistent Infections.  Vitals  Weight: 146.2 lb Height: 64in Body Surface Area: 1.71 m Body Mass Index: 25.09 kg/m  Temp.: 98.29F  Pulse: 67 (Regular)  BP: 116/72 (Sitting, Left Arm, Standard) Integumentary Global Assessment Examination of related systems reveals - Well-developed, well-nourished and in no acute distress; alert and oriented x 3. Eye Eyeball - Bilateral-Extraocular movements intact. Sclera/Conjunctiva - Bilateral-No scleral icterus. Chest and Lung Exam Chest and lung exam reveals -quiet, even and easy respiratory effort with no use of accessory muscles and on auscultation, normal breath sounds, no adventitious sounds and normal vocal resonance. Breast Nipples-No Discharge. Breast Lump-No Palpable Breast  Mass. Cardiovascular Cardiovascular examination reveals -on palpation PMI is normal in location and amplitude, no palpable S3 or S4. Normal cardiac borders. and normal heart sounds, regular rate and rhythm with no murmurs. Abdomen  Palpation/Percussion Palpation and Percussion of the abdomen reveal - Soft and Non Tender. Lymphatic Head & Neck General Head & Neck Lymphatics: Bilateral - Description - Normal. Axillary General Axillary Region: Bilateral - Description - Normal. Note: no Poston adenopathy   Assessment & Plan  BREAST MASS, RIGHT (N63.10) Story: Right breast seed guided excision we discussed observation vs excisional biopsy. I think with age and appearance reasonable to proceed with excision. we discussed seed guided excision and risks/recovery. 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

## 2017-02-06 NOTE — Anesthesia Procedure Notes (Signed)
Procedure Name: LMA Insertion Date/Time: 02/06/2017 10:13 AM Performed by: Toula Moos L Pre-anesthesia Checklist: Patient identified, Emergency Drugs available, Suction available, Patient being monitored and Timeout performed Patient Re-evaluated:Patient Re-evaluated prior to inductionOxygen Delivery Method: Circle system utilized Preoxygenation: Pre-oxygenation with 100% oxygen Intubation Type: IV induction Ventilation: Mask ventilation without difficulty LMA: LMA inserted LMA Size: 4.0 Number of attempts: 1 Airway Equipment and Method: Bite block Placement Confirmation: positive ETCO2 Tube secured with: Tape Dental Injury: Teeth and Oropharynx as per pre-operative assessment

## 2017-02-07 ENCOUNTER — Encounter (HOSPITAL_BASED_OUTPATIENT_CLINIC_OR_DEPARTMENT_OTHER): Payer: Self-pay | Admitting: General Surgery

## 2018-07-15 ENCOUNTER — Ambulatory Visit: Payer: BC Managed Care – PPO | Admitting: Psychiatry

## 2018-07-15 DIAGNOSIS — F321 Major depressive disorder, single episode, moderate: Secondary | ICD-10-CM | POA: Diagnosis not present

## 2018-07-15 NOTE — Progress Notes (Signed)
      [facility] Counselor/Therapist Progress Note   Patient ID: Cynthia Irwin, MRN: [id number]  Date: 07/15/2018  Timespent: 60 minutes  Treatment Type: Individual  Subjective: The client reports that she is having a midlife crisis.  "I feel this longing for heaven because there is so much brokeness and sadness in the world.  What am I my doing, where is my life going?  I just do not feel at peace."  As I discussed these thoughts with the client it became clear that she is just not satisfied in her life at 47 years of age.  I asked her what brings her joy?  The client had made a list on her phone.  It was things like hiking, kayaking, deep conversations and significant engagement with others.Teaching and speaking as well.  I asked the client to begin to think outside the box about what her next steps would be.  None of the things we talked about today needs to happen by the end of the week.  It would take time, it would take thought, it would take brainstorming with others.  The client thought about developing an outdoor recreation school that could be applied in a gap year.  She will look at other nonprofits that do similar things but in a less formalized way.  The client was very energized by this whole concept. The client also discussed her 4 year old daughter.  They are going out of town this coming weekend and her daughter wanted to stay home.  We talked about the concepts of privileges versus responsibilities and how comfortable the client was leaving her 38 year old daughter at home.  She was unsure how comfortable she was with that.  Some options we talked about was staying with another family or having 1 of the college students they know coming to be with her.  The client will consider that with her husband tonight.  Interventions:Solution Focused and Supportive  Mental Status Exam:   Appearance:   Well Groomed     Behavior:  Appropriate  Motor:  Normal  Speech/Language:   Clear  and Coherent  Affect:  Appropriate  Mood:  anxious and sad  Thought process:  Coherent  Thought content:    Logical  Perceptual disturbances:    Normal  Orientation:  Full (Time, Place, and Person)  Attention:  Good  Concentration:  good  Memory:  Immediate  Fund of knowledge:   Good  Insight:    Good  Judgment:   Good  Impulse Control:  good    Reported Symptoms: Anxiety, sadness  Risk Assessment: Danger to Self:  No Self-injurious Behavior: No Danger to Others: No Duty to Warn:no Physical Aggression / Violence:No  Access to Firearms a concern: No  Gang Involvement:No   Diagnosis:   ICD-10-CM   1. Major depressive disorder, single episode, moderate ([place]) F32.1      Plan: Journal, brainstorm with friends.  Albertina Parr [place], [place]

## 2018-10-15 ENCOUNTER — Ambulatory Visit (INDEPENDENT_AMBULATORY_CARE_PROVIDER_SITE_OTHER): Payer: BC Managed Care – PPO | Admitting: Psychiatry

## 2018-10-15 DIAGNOSIS — F321 Major depressive disorder, single episode, moderate: Secondary | ICD-10-CM

## 2018-10-16 ENCOUNTER — Encounter: Payer: Self-pay | Admitting: Psychiatry

## 2018-10-16 NOTE — Progress Notes (Signed)
      [facility] Counselor/Therapist Progress Note  Patient ID: Cynthia Irwin, MRN: [id number],    Date: 10/15/2018  Time Spent: 60 minutes   Treatment Type: Individual Therapy  Reported Symptoms: Depressed mood and Anxious Mood  Mental Status Exam:  Appearance:   Well Groomed     Behavior:  Appropriate  Motor:  Normal  Speech/Language:   Clear and Coherent  Affect:  Appropriate  Mood:  anxious, depressed and sad  Thought process:  normal  Thought content:    WNL  Sensory/Perceptual disturbances:    WNL  Orientation:  oriented to person, place, time/date and situation  Attention:  Good  Concentration:  Good  Memory:  WNL  Fund of knowledge:   Good  Insight:    Good  Judgment:   Good  Impulse Control:  Good   Risk Assessment: Danger to Self:  No Self-injurious Behavior: No Danger to Others: No Duty to Warn:no Physical Aggression / Violence:No  Access to Firearms a concern: No  Gang Involvement:No   Subjective: The client reports that her husband has been having small seizures.  They are happening almost every day.  It has been almost 10 years since he had a seizure.  At the time when they did a scan they discovered that he had a grade 3 tumor in his brain.  Tomorrow they are traveling to [facility] for another scan and hopefully receive the report on what is going on.  They made a just have to adjust his medications or he Cynthia Irwin have to have surgery again.  The client is very anxious and sad about this.  She states, "our whole marriage has been around the this disease."  She describes a lot of catastrophic thinking with her being a widow and having to raise her children on her own.  We discussed being mindful ins and staying in the present tense.  We also discussed radical acceptance. I used EMDR with the client to decrease her level of disturbance from a subjective units of distress of 8+ to less than 3 at the end of the session.  Her focus tomorrow will be to take  things as they come.  We discussed ways she could distract herself and focus on the moment.  The client agreed.  Interventions: Mindfulness Meditation, Solution-Oriented/Positive Psychology, [employer] Group Desensitization and Reprocessing (EMDR) and Insight-Oriented  Diagnosis:   ICD-10-CM   1. Major depressive disorder, single episode, moderate (HCC) F32.1     Plan: Mindfulness, distraction, radical acceptance.  Cynthia Irwin, [place]

## 2018-10-20 ENCOUNTER — Ambulatory Visit: Payer: BC Managed Care – PPO | Admitting: Psychiatry

## 2018-12-11 ENCOUNTER — Ambulatory Visit: Payer: BC Managed Care – PPO | Admitting: Psychiatry

## 2018-12-22 ENCOUNTER — Other Ambulatory Visit: Payer: Self-pay

## 2018-12-22 ENCOUNTER — Encounter: Payer: Self-pay | Admitting: Psychiatry

## 2018-12-22 ENCOUNTER — Ambulatory Visit (INDEPENDENT_AMBULATORY_CARE_PROVIDER_SITE_OTHER): Payer: BC Managed Care – PPO | Admitting: Psychiatry

## 2018-12-22 DIAGNOSIS — F4322 Adjustment disorder with anxiety: Secondary | ICD-10-CM

## 2018-12-22 NOTE — Progress Notes (Signed)
      [facility] Counselor/Therapist Progress Note  Patient ID: Cynthia Irwin, MRN: [id number],    Date: 12/22/2018  Time Spent: 70 minutes   Treatment Type: Individual Therapy  Reported Symptoms: anxiety.  Mental Status Exam:  Appearance:   Casual and Well Groomed     Behavior:  Appropriate  Motor:  Normal  Speech/Language:   Clear and Coherent  Affect:  Appropriate  Mood:  anxious  Thought process:  normal  Thought content:    WNL  Sensory/Perceptual disturbances:    WNL  Orientation:  oriented to person, place, time/date and situation  Attention:  Good  Concentration:  Good  Memory:  WNL  Fund of knowledge:   Good  Insight:    Good  Judgment:   Good  Impulse Control:  Good   Risk Assessment: Danger to Self:  No Self-injurious Behavior: No Danger to Others: No Duty to Warn:no Physical Aggression / Violence:No  Access to Firearms a concern: No  Gang Involvement:No   Subjective: The client reports that since last session her husband has gotten the results of his MRI.  It seems in the deep part of his brain he might of had a stroke.  In the spot where his previous tumor was there is something that they are unsure of what it is.  This coming Thursday they go back to [facility] for another scan to see if that spot has grown or not.  Then they will determine if he has to have surgery again. In addition to that the client's father-in-law had to have a benign mangioma removed from the back of his skull.  He whether the surgery well but developed a pulmonary embolism.  "That was difficult for everyone involved." The client is also worried about her husband's job as a Clinical biochemist at hospice.  He has a meeting tomorrow with his supervisor and someone from HR.  She is worried that he might lose his job.  This Wednesday is also her daughter's 17th birthday. Today the client went to use the EMDR hand paddles to do visualization with Jesus and these different circumstances.  The client was  able to successfully see that and found that she had great acceptance at what ever might be coming down the pike.  She finds herself less worried about the corona virus pandemic because that is "just 1 more thing out of my control."  The client subjective units of distress at the end was less than 2.  Interventions: Solution-Oriented/Positive Psychology, Eye Movement Desensitization and Reprocessing (EMDR) and Insight-Oriented  Diagnosis:   ICD-10-CM   1. Adjustment disorder with anxious mood F43.22     Plan: Positive self talk, prayer, self-care.  This record has been created using [employer].  Chart creation errors have been sought, but Tangelia Sanson not always have been located and corrected. Such creation errors do not reflect on the standard of medical care.   Kasen Sako, [place]

## 2019-04-01 ENCOUNTER — Other Ambulatory Visit: Payer: Self-pay

## 2019-04-01 ENCOUNTER — Ambulatory Visit
Admission: RE | Admit: 2019-04-01 | Discharge: 2019-04-01 | Disposition: A | Discharge status: Home / Self Care | Payer: BC Managed Care – PPO | Source: Ambulatory Visit | Attending: Physician Assistant | Admitting: Physician Assistant

## 2019-04-01 ENCOUNTER — Other Ambulatory Visit: Payer: Self-pay | Admitting: Physician Assistant

## 2019-04-01 DIAGNOSIS — Z1231 Encounter for screening mammogram for malignant neoplasm of breast: Secondary | ICD-10-CM

## 2019-04-10 ENCOUNTER — Encounter: Payer: Self-pay | Admitting: Psychiatry

## 2019-04-10 ENCOUNTER — Other Ambulatory Visit: Payer: Self-pay

## 2019-04-10 ENCOUNTER — Ambulatory Visit: Payer: BC Managed Care – PPO | Admitting: Psychiatry

## 2019-04-10 DIAGNOSIS — F321 Major depressive disorder, single episode, moderate: Secondary | ICD-10-CM | POA: Diagnosis not present

## 2019-04-10 NOTE — Progress Notes (Signed)
      [facility] Counselor/Therapist Progress Note  Patient ID: Cynthia Irwin, MRN: [id number],    Date: 04/10/2019  Time Spent: 65 minutes   Treatment Type: Individual Therapy  Reported Symptoms: grief, sadness, disappointment  Mental Status Exam:  Appearance:   Casual and Well Groomed     Behavior:  Appropriate  Motor:  Normal  Speech/Language:   Clear and Coherent  Affect:  Appropriate  Mood:  sad  Thought process:  normal  Thought content:    WNL  Sensory/Perceptual disturbances:    WNL  Orientation:  oriented to person, place, time/date and situation  Attention:  Good  Concentration:  Good  Memory:  WNL  Fund of knowledge:   Good  Insight:    Good  Judgment:   Good  Impulse Control:  Good   Risk Assessment: Danger to Self:  No Self-injurious Behavior: No Danger to Others: No Duty to Warn:no Physical Aggression / Violence:No  Access to Firearms a concern: No  Gang Involvement:No   Subjective: I met the client face to face.  We both had facemask's.  The client reports that her husband died of a brain bleed on 02-20-23 of this year.  He was at [facility] having a biopsy for the tumor that had appeared on the most recent MRI.  He was in the hospital for 5 or 6 days unresponsive before he died.  "It has been hard especially for my son."  The client has experienced both her husband's birthday and Father's Day since his death.  Her father-in-law has also been sick and grieving as well.  He had just had hemangioma removed and is fatigued and dizzy. The client states that she was overwhelmed by the amount of sympathy cards that she received about her husband.  She states, "even the people who work at the [employer] up in [place] sent sympathy cards.  It seemed he knew everyone."   The client is still navigating being alone.  She has been disappointed at the poor response from her church.  She states it has brought her daughter and her closer together. We discussed the  grief process with the client.  She is reading the book, "good grief".  She states she wants to be able to get outside and hike but does not want to do it alone.  The client will consider making a list of her female friends that would be open to hiking.  She can rotate those people so that she can always be hiking on the weekends or doing other outdoor activities she enjoys.  She will also hold off on making any big decisions for the next year.  At the next session we will make a plan about church, future job, her children, and what life is going to look like for her.    Interventions: Assertiveness/Communication, Motivational Interviewing, Solution-Oriented/Positive Psychology, Grief Therapy, Psycho-education/Bibliotherapy, Eye Movement Desensitization and Reprocessing (EMDR) and Insight-Oriented  Diagnosis:   ICD-10-CM   1. Major depressive disorder, single episode, moderate (HCC)  F32.1     Plan: self care, hike, boundaries, assertiveness.  This record has been created using [employer].  Chart creation errors have been sought, but Byanka Landrus not always have been located and corrected. Such creation errors do not reflect on the standard of medical care.  Maurianna Benard, [facility]

## 2019-04-24 ENCOUNTER — Ambulatory Visit (INDEPENDENT_AMBULATORY_CARE_PROVIDER_SITE_OTHER): Payer: BC Managed Care – PPO | Admitting: Psychiatry

## 2019-04-24 ENCOUNTER — Other Ambulatory Visit: Payer: Self-pay

## 2019-04-24 ENCOUNTER — Encounter: Payer: Self-pay | Admitting: Psychiatry

## 2019-04-24 DIAGNOSIS — F321 Major depressive disorder, single episode, moderate: Secondary | ICD-10-CM | POA: Diagnosis not present

## 2019-04-24 NOTE — Progress Notes (Signed)
    [facility] Counselor/Therapist Progress Note  Patient ID: Cynthia Irwin, MRN: [id number],    Date: 04/24/2019  Time Spent: 56 minutes   Treatment Type: Individual Therapy  Reported Symptoms: sad, grief, anxious.  Mental Status Exam:  Appearance:   Casual and Well Groomed     Behavior:  Appropriate  Motor:  Normal  Speech/Language:   Clear and Coherent  Affect:  Appropriate  Mood:  anxious, sad and grief  Thought process:  normal  Thought content:    WNL  Sensory/Perceptual disturbances:    WNL  Orientation:  oriented to person, place, time/date and situation  Attention:  Good  Concentration:  Good  Memory:  WNL  Fund of knowledge:   Good  Insight:    Good  Judgment:   Good  Impulse Control:  Good   Risk Assessment: Danger to Self:  No Self-injurious Behavior: No Danger to Others: No Duty to Warn:no Physical Aggression / Violence:No  Access to Firearms a concern: No  Gang Involvement:No   Subjective: I met with the client face-to-face.  We both had facemasks. The client's ankle was recently sprained.  "It slowed me down."  She continues to struggle with the death of her husband.  It is been 3 months and she states, "I cannot believe this is real."  She describes it not getting easier but only more difficult.  The life insurance that her husband had is taking a long time to pay out the money.  The insurance company wants to make sure that the death of the client's husband was not caused by his disease.  It does clearly state on the death certificate the cause of death as respiratory failure and a type of brain bleed.  This was caused by the biopsy and not the brain tumor. The client states she has spent literally hours on the phone with the insurance company.  They have not gotten records which the client has taken upon herself to get for the insurance company.  She continues to a bird dog the insurance process to make sure that all the bases are covered so that she  can have a good outcome.  She and her husband had decided to make a move (house) before he got sick to the school district for [employer].  She is moving forward with this but may be delayed because of the lack of the insurance money.  This frustrates her greatly. I used the bilateral stimulation hand paddles with the client to help reduce her sense of grief and sadness.  From 7 to 2 on the subjective units of distress scale.  She is slowly packing up her house but is hesitant to go through sentimental items belonging to her husband.  "I cry so much."  I encouraged the client to do some journaling by writing letters to her husband.  The client also has access to her husband's journals.  Some of the information is very personal.  I encouraged the client to go through and remove what she thinks are important bits to save for her children and grandchildren while redacting the very personal information.  She agreed.  Once her ankle heals she will start back to walking and hiking.  Interventions: Assertiveness/Communication, Motivational Interviewing, Solution-Oriented/Positive Psychology, Eye Movement Desensitization and Reprocessing (EMDR) and Insight-Oriented  Diagnosis:   ICD-10-CM   1. Major depressive disorder, single episode, moderate (HCC)  F32.1     Plan: Journaling letters to husband, saving the important parts   of his journal, self-care, social network.  This record has been created using [employer].  Chart creation errors have been sought, but Cynthia Irwin not always have been located and corrected. Such creation errors do not reflect on the standard of medical care.  This record has been created using [employer].  Chart creation errors have been sought, but Cynthia Irwin not always have been located and corrected. Such creation errors do not reflect on the standard of medical care.  Cynthia Irwin, [facility]

## 2019-04-30 ENCOUNTER — Ambulatory Visit: Payer: BC Managed Care – PPO | Admitting: Psychiatry

## 2019-05-07 ENCOUNTER — Other Ambulatory Visit: Payer: Self-pay

## 2019-05-07 ENCOUNTER — Ambulatory Visit (INDEPENDENT_AMBULATORY_CARE_PROVIDER_SITE_OTHER): Payer: BC Managed Care – PPO | Admitting: Psychiatry

## 2019-05-07 ENCOUNTER — Encounter: Payer: Self-pay | Admitting: Psychiatry

## 2019-05-07 DIAGNOSIS — F321 Major depressive disorder, single episode, moderate: Secondary | ICD-10-CM | POA: Diagnosis not present

## 2019-05-07 NOTE — Progress Notes (Signed)
      [facility] Counselor/Therapist Progress Note  Patient ID: TANISA LAGACE, MRN: [id number],    Date: 05/07/2019  Time Spent: 50 minutes   Treatment Type: Individual Therapy  Reported Symptoms: sadness, grief, teraful.  Mental Status Exam:  Appearance:   Casual and Well Groomed     Behavior:  Appropriate  Motor:  Normal  Speech/Language:   Clear and Coherent  Affect:  Tearful  Mood:  anxious and sad  Thought process:  normal  Thought content:    WNL  Sensory/Perceptual disturbances:    WNL  Orientation:  oriented to person, place, time/date and situation  Attention:  Good  Concentration:  Good  Memory:  WNL  Fund of knowledge:   Good  Insight:    Good  Judgment:   Good  Impulse Control:  Good   Risk Assessment: Danger to Self:  No Self-injurious Behavior: No Danger to Others: No Duty to Warn:no Physical Aggression / Violence:No  Access to Firearms a concern: No  Gang Involvement:No   Subjective: The client recently returned from a short vacation at [facility].  She had invited another family of friends down which made the time very pleasant.  "It was fine." Today the client states that things have been particularly hard here at the three-month mark.  "I miss my husband tremendously.  There is a huge void in her household."  She recently went through the garage which was his workspace with her in-laws.  "So much of it was about him." As the client discussed that she was very tearful processing her sadness and grief at the loss of her husband. The client is in the process of looking for a new house.  She and her husband had decided to do that before he died.  She states leaving the house will be like leaving part of him behind.  We discussed what she could do in her new house to remember her husband.  She found some rocks, which he like to collect, in the garage.  She and her son pick the best ones to put in the yard at the new house. The client has not had time to  journal.  She does realize that if her husband was to die at least he went quick and she and her 2 children could all be together.  No one was off to college or living somewhere else.  For that she is grateful. Client's ankle continues to heal slowly.  She is working on reaching out to friends and increasing her self-care.  I used the bilateral stimulation hand paddles with the client to help process her grief.  Her subjective units of distress went from a 7+ to less than 2 at the end of the session.  Interventions: Motivational Interviewing, Solution-Oriented/Positive Psychology, [employer] Group Desensitization and Reprocessing (EMDR) and Insight-Oriented  Diagnosis:   ICD-10-CM   1. Major depressive disorder, single episode, moderate ([place])  F32.1     Plan: Social network, grief work, self-care, exercise.  This record has been created using [employer].  Chart creation errors have been sought, but Jahaan Vanwagner not always have been located and corrected. Such creation errors do not reflect on the standard of medical care.  Alyscia Carmon, [facility]

## 2019-06-10 ENCOUNTER — Other Ambulatory Visit: Payer: Self-pay

## 2019-06-10 ENCOUNTER — Encounter: Payer: Self-pay | Admitting: Psychiatry

## 2019-06-10 ENCOUNTER — Ambulatory Visit (INDEPENDENT_AMBULATORY_CARE_PROVIDER_SITE_OTHER): Payer: BC Managed Care – PPO | Admitting: Psychiatry

## 2019-06-10 DIAGNOSIS — F4323 Adjustment disorder with mixed anxiety and depressed mood: Secondary | ICD-10-CM | POA: Diagnosis not present

## 2019-06-10 NOTE — Progress Notes (Signed)
[facility] Counselor/Therapist Progress Note  Patient ID: Cynthia Irwin, MRN: [id number],    Date: 06/10/2019  Time Spent: 50 minutes   Treatment Type: Individual Therapy  Reported Symptoms: sadness, anxious  Mental Status Exam:  Appearance:   Casual     Behavior:  Appropriate  Motor:  Normal  Speech/Language:   Clear and Coherent  Affect:  Appropriate  Mood:  anxious and sad  Thought process:  normal  Thought content:    WNL  Sensory/Perceptual disturbances:    WNL  Orientation:  oriented to person, place, time/date and situation  Attention:  Good  Concentration:  Good  Memory:  WNL  Fund of knowledge:   Good  Insight:    Good  Judgment:   Good  Impulse Control:  Good   Risk Assessment: Danger to Self:  No Self-injurious Behavior: No Danger to Others: No Duty to Warn:no Physical Aggression / Violence:No  Access to Firearms a concern: No  Gang Involvement:No   Subjective: The client states that she has been very stressed trying to adjust to her new school schedule with her children.  Since everything is online there has been more chaos than she is used to.  Her daughter is in the international baccalaureate program at [employer].  Her math class that she was taking was canceled and she had to take the same class over at [employer].  Unfortunately that class conflicts with another class she has to take.  The client is upset at the school system is not more accommodating. Her daughter has also been working on her college essay which is mainly about her father that recently died.  The client felt that her daughter did an excellent job.  It articulated what she learned from her father and how she could apply it to her future. The client is also realizing day by day how much her husband saw things differently than other people did.  She realized that he focused on the important things and ignored the things that did not matter.  "This had a huge impact on  [place] all."  The client has also recently bought a new house which is around the corner from the house her husband grew up in.  "He would be very pleased."  We discussed the difficulty of transitioning from their old house which has so many memories to their.  The client will ask friends to come over when they move to be there with her.  The client has also been following up with the grief share program at [facility].  She states she does not like to go but has found it helpful.  The most difficult thing has been the loneliness day today. At the next session we will focus on the nervousness she has about the move that is coming up in October.  She is working on radical acceptance with everything that is happening.  She especially needs it with the continued tussle with the insurance company over her husband's death.  They have not paid out the life insurance and are dragging their feet on it.  This frustrates the client to no end but she realizes she must accept it as that is even though she does not approve.    Interventions: Assertiveness/Communication, Solution-Oriented/Positive Psychology and Insight-Oriented  Diagnosis:   ICD-10-CM   1. Adjustment disorder with mixed anxiety and depressed mood  F43.23     Plan: Assertiveness, boundaries, radical acceptance, self-care, exercise.  Albertina Parr  Lucky Trotta, [facility]

## 2019-06-24 ENCOUNTER — Ambulatory Visit: Payer: BC Managed Care – PPO | Admitting: Psychiatry

## 2019-07-06 ENCOUNTER — Ambulatory Visit (INDEPENDENT_AMBULATORY_CARE_PROVIDER_SITE_OTHER): Payer: BC Managed Care – PPO | Admitting: Psychiatry

## 2019-07-06 ENCOUNTER — Encounter: Payer: Self-pay | Admitting: Psychiatry

## 2019-07-06 ENCOUNTER — Other Ambulatory Visit: Payer: Self-pay

## 2019-07-06 DIAGNOSIS — F4323 Adjustment disorder with mixed anxiety and depressed mood: Secondary | ICD-10-CM | POA: Diagnosis not present

## 2019-07-06 NOTE — Progress Notes (Signed)
      [facility] Counselor/Therapist Progress Note  Patient ID: Cynthia Irwin, MRN: [id number],    Date: 07/06/2019  Time Spent: 50 minutes   Treatment Type: Individual Therapy  Reported Symptoms: sad, anxious  Mental Status Exam:  Appearance:   Casual     Behavior:  Appropriate  Motor:  Normal  Speech/Language:   Clear and Coherent  Affect:  Appropriate  Mood:  anxious and sad  Thought process:  normal  Thought content:    WNL  Sensory/Perceptual disturbances:    WNL  Orientation:  oriented to person, place, time/date and situation  Attention:  Good  Concentration:  Good  Memory:  WNL  Fund of knowledge:   Good  Insight:    Good  Judgment:   Good  Impulse Control:  Good   Risk Assessment: Danger to Self:  No Self-injurious Behavior: No Danger to Others: No Duty to Warn:no Physical Aggression / Violence:No  Access to Firearms a concern: No  Gang Involvement:No   Subjective: The client states that she moved into her new house this past Friday. She states, "I am glad to be away from my old neighborhood ".  The neighbors at her previous house were difficult.  She states that it is a relief not to have to deal with them.  At her current house there are many people she knows in the neighborhood.  She was glad that her house sold as quickly as it did for $20,000 over the asking price.  "I know God is taking care of me". The client states that she was able to fill out her daughters FAFSA's form.  Two days later the insurance company called stating that she was approved for payment from her husband's life insurance policy.  It was consequential that it was 2 days after the FASFA was sent.  It did not impact their current income level for her daughter's college. She states she and her children feel settled in the house.  It seems like the "right" place.  She is considering what to do for Christmas.  This will be the first Christmas without her husband.  She had been advised to go  somewhere else.  Since she is not in their original house we discussed her staying there and making memories with her children and somehow including her deceased husband.  She thought this was a reasonable idea which she will consider. The client has been able to meet with friends.  Her foot has improved to the point where she can walk again.  She feels she is doing much better overall.  She still has episodes of sadness and some anxiety when she thinks of the future.   Interventions: Assertiveness/Communication, Motivational Interviewing, Solution-Oriented/Positive Psychology and Insight-Oriented  Diagnosis:   ICD-10-CM   1. Adjustment disorder with mixed anxiety and depressed mood  F43.23     Plan: Boundaries, assertiveness, self-care, social network, grief work around her husband's death.  Retta Pitcher, [facility]

## 2019-07-29 ENCOUNTER — Other Ambulatory Visit: Payer: Self-pay

## 2019-07-29 DIAGNOSIS — Z20822 Contact with and (suspected) exposure to covid-19: Secondary | ICD-10-CM

## 2019-07-30 LAB — NOVEL CORONAVIRUS, NAA: SARS-CoV-2, NAA: NOT DETECTED

## 2019-08-17 ENCOUNTER — Ambulatory Visit: Payer: BC Managed Care – PPO | Admitting: Psychiatry

## 2019-11-23 ENCOUNTER — Encounter: Payer: Self-pay | Admitting: Internal Medicine

## 2019-12-14 ENCOUNTER — Other Ambulatory Visit: Payer: Self-pay | Admitting: General Surgery

## 2019-12-14 DIAGNOSIS — D179 Benign lipomatous neoplasm, unspecified: Secondary | ICD-10-CM

## 2019-12-22 ENCOUNTER — Other Ambulatory Visit: Payer: Self-pay

## 2019-12-22 ENCOUNTER — Ambulatory Visit (AMBULATORY_SURGERY_CENTER): Payer: Self-pay

## 2019-12-22 VITALS — Temp 97.6°F | Ht 64.0 in | Wt 164.0 lb

## 2019-12-22 DIAGNOSIS — Z01818 Encounter for other preprocedural examination: Secondary | ICD-10-CM

## 2019-12-22 DIAGNOSIS — Z1211 Encounter for screening for malignant neoplasm of colon: Secondary | ICD-10-CM

## 2019-12-22 MED ORDER — NA SULFATE-K SULFATE-MG SULF 17.5-3.13-1.6 GM/177ML PO SOLN: 1.0000 | Freq: Once | ORAL | 0 refills | Status: AC

## 2019-12-22 NOTE — Progress Notes (Signed)
No egg or soy allergy known to patient  No issues with past sedation with any surgeries  or procedures, no intubation problems  No diet pills per patient No home 02 use per patient  No blood thinners per patient  Pt denies issues with constipation  No A fib or A flutter  EMMI video sent to pt's e mail   Pt is recent widow, husband died from brain cancer 2019/01/21.  suprep coupon and code given  Due to the COVID-19 pandemic we are asking patients to follow these guidelines. Please only bring one care partner. Please be aware that your care partner may wait in the car in the parking lot or if they feel like they will be too hot to wait in the car, they may wait in the lobby on the 4th floor. All care partners are required to wear a mask the entire time (we do not have any that we can provide them), they need to practice social distancing, and we will do a Covid check for all patient's and care partners when you arrive. Also we will check their temperature and your temperature. If the care partner waits in their car they need to stay in the parking lot the entire time and we will call them on their cell phone when the patient is ready for discharge so they can bring the car to the front of the building. Also all patient's will need to wear a mask into building.

## 2019-12-31 ENCOUNTER — Ambulatory Visit: Payer: 59 | Attending: Internal Medicine

## 2019-12-31 DIAGNOSIS — Z23 Encounter for immunization: Secondary | ICD-10-CM

## 2019-12-31 NOTE — Progress Notes (Signed)
   Covid-19 Vaccination Clinic  Name:  Cynthia Irwin    MRN: [id number] DOB: 1971-03-16  12/31/2019  [name] was observed post Covid-19 immunization for 15 minutes without incident. She was provided with Vaccine Information Sheet and instruction to access the V-Safe system.   Ms. Bridge was instructed to call 911 with any severe reactions post vaccine: Marland Kitchen Difficulty breathing  . Swelling of face and throat  . A fast heartbeat  . A bad rash all over body  . Dizziness and weakness   Immunizations Administered    Name Date Dose VIS Date Route   Pfizer COVID-19 Vaccine 12/31/2019  3:33 PM 0.3 mL 09/11/2019 Intramuscular   Manufacturer: [place]   Lot: [id number]   [place]: [id number]

## 2020-01-04 ENCOUNTER — Ambulatory Visit
Admission: RE | Admit: 2020-01-04 | Discharge: 2020-01-04 | Disposition: A | Discharge status: Home / Self Care | Payer: BC Managed Care – PPO | Source: Ambulatory Visit | Attending: General Surgery | Admitting: General Surgery

## 2020-01-04 DIAGNOSIS — D179 Benign lipomatous neoplasm, unspecified: Secondary | ICD-10-CM

## 2020-01-04 MED ORDER — IOPAMIDOL (ISOVUE-300) INJECTION 61%
100.0000 mL | Freq: Once | INTRAVENOUS | Status: AC | PRN
  Administered 2020-01-04: 100 mL via INTRAVENOUS

## 2020-01-05 ENCOUNTER — Encounter: Payer: BC Managed Care – PPO | Admitting: Internal Medicine

## 2020-01-11 ENCOUNTER — Other Ambulatory Visit: Payer: Self-pay | Admitting: General Surgery

## 2020-01-14 ENCOUNTER — Ambulatory Visit (INDEPENDENT_AMBULATORY_CARE_PROVIDER_SITE_OTHER): Payer: 59

## 2020-01-14 ENCOUNTER — Other Ambulatory Visit: Payer: Self-pay | Admitting: Internal Medicine

## 2020-01-14 DIAGNOSIS — Z1159 Encounter for screening for other viral diseases: Secondary | ICD-10-CM

## 2020-01-14 LAB — SARS CORONAVIRUS 2 (TAT 6-24 HRS): SARS Coronavirus 2: NEGATIVE

## 2020-01-19 ENCOUNTER — Other Ambulatory Visit: Payer: Self-pay

## 2020-01-19 ENCOUNTER — Ambulatory Visit (AMBULATORY_SURGERY_CENTER): Payer: 59 | Admitting: Internal Medicine

## 2020-01-19 ENCOUNTER — Encounter: Payer: Self-pay | Admitting: Internal Medicine

## 2020-01-19 VITALS — BP 105/72 | HR 74 | Temp 97.5°F | Resp 20 | Ht 64.0 in | Wt 164.0 lb

## 2020-01-19 DIAGNOSIS — Z1211 Encounter for screening for malignant neoplasm of colon: Secondary | ICD-10-CM | POA: Diagnosis not present

## 2020-01-19 MED ORDER — SODIUM CHLORIDE 0.9 % IV SOLN: 500.0000 mL | Freq: Once | INTRAVENOUS | Status: DC

## 2020-01-19 NOTE — Op Note (Signed)
[facility] Patient Name: Cynthia Irwin Procedure Date: 01/19/2020 2:39 PM MRN: [id number] Endoscopist: Jerene Bears , MD Age: 49 Referring MD:  Date of Birth: 12/05/1970 Gender: Female Account #: [id number] Procedure:                Colonoscopy Indications:              Screening for colorectal malignant neoplasm, This                            is the patient's first colonoscopy Medicines:                Monitored Anesthesia Care Procedure:                Pre-Anesthesia Assessment:                           - Prior to the procedure, a History and Physical                            was performed, and patient medications and                            allergies were reviewed. The patient's tolerance of                            previous anesthesia was also reviewed. The risks                            and benefits of the procedure and the sedation                            options and risks were discussed with the patient.                            All questions were answered, and informed consent                            was obtained. Prior Anticoagulants: The patient has                            taken no previous anticoagulant or antiplatelet                            agents. ASA Grade Assessment: II - A patient with                            mild systemic disease. After reviewing the risks                            and benefits, the patient was deemed in                            satisfactory condition to undergo the procedure.  After obtaining informed consent, the colonoscope                            was passed under direct vision. Throughout the                            procedure, the patient's blood pressure, pulse, and                            oxygen saturations were monitored continuously. The                            Colonoscope was introduced through the anus and                            advanced to the cecum,  identified by appendiceal                            orifice and ileocecal valve. The colonoscopy was                            performed without difficulty. The patient tolerated                            the procedure well. The quality of the bowel                            preparation was good. The ileocecal valve,                            appendiceal orifice, and rectum were photographed. Scope In: 2:47:03 PM Scope Out: 3:02:13 PM Scope Withdrawal Time: 0 hours 12 minutes 6 seconds  Total Procedure Duration: 0 hours 15 minutes 10 seconds  Findings:                 The digital rectal exam was normal.                           The entire examined colon appeared normal on direct                            and retroflexion views. Complications:            No immediate complications. Estimated Blood Loss:     Estimated blood loss: none. Impression:               - The entire examined colon is normal on direct and                            retroflexion views.                           - No specimens collected. Recommendation:           - Patient has a contact number available for  emergencies. The signs and symptoms of potential                            delayed complications were discussed with the                            patient. Return to normal activities tomorrow.                            Written discharge instructions were provided to the                            patient.                           - Resume previous diet.                           - Continue present medications.                           - Repeat colonoscopy in 10 years for screening                            purposes. Jerene Bears, MD 01/19/2020 3:03:54 PM This report has been signed electronically.

## 2020-01-19 NOTE — Progress Notes (Signed)
Report given to PACU, vss 

## 2020-01-19 NOTE — Patient Instructions (Signed)
Discharge instructions given. Normal exam. Resume previous medications. YOU HAD AN ENDOSCOPIC PROCEDURE TODAY AT THE [facility]:   Refer to the procedure report that was given to you for any specific questions about what was found during the examination.  If the procedure report does not answer your questions, please call your gastroenterologist to clarify.  If you requested that your care partner not be given the details of your procedure findings, then the procedure report has been included in a sealed envelope for you to review at your convenience later.  YOU SHOULD EXPECT: Some feelings of bloating in the abdomen. Passage of more gas than usual.  Walking can help get rid of the air that was put into your GI tract during the procedure and reduce the bloating. If you had a lower endoscopy (such as a colonoscopy or flexible sigmoidoscopy) you may notice spotting of blood in your stool or on the toilet paper. If you underwent a bowel prep for your procedure, you may not have a normal bowel movement for a few days.  Please Note:  You might notice some irritation and congestion in your nose or some drainage.  This is from the oxygen used during your procedure.  There is no need for concern and it should clear up in a day or so.  SYMPTOMS TO REPORT IMMEDIATELY:  Following lower endoscopy (colonoscopy or flexible sigmoidoscopy):  Excessive amounts of blood in the stool  Significant tenderness or worsening of abdominal pains  Swelling of the abdomen that is new, acute  Fever of 100F or higher   For urgent or emergent issues, a gastroenterologist can be reached at any hour by calling [phone]. Do not use MyChart messaging for urgent concerns.    DIET:  We do recommend a small meal at first, but then you may proceed to your regular diet.  Drink plenty of fluids but you should avoid alcoholic beverages for 24 hours.  ACTIVITY:  You should plan to take it easy for the rest of  today and you should NOT DRIVE or use heavy machinery until tomorrow (because of the sedation medicines used during the test).    FOLLOW UP: Our staff will call the number listed on your records 48-72 hours following your procedure to check on you and address any questions or concerns that you may have regarding the information given to you following your procedure. If we do not reach you, we will leave a message.  We will attempt to reach you two times.  During this call, we will ask if you have developed any symptoms of COVID 19. If you develop any symptoms (ie: fever, flu-like symptoms, shortness of breath, cough etc.) before then, please call [phone].  If you test positive for Covid 19 in the 2 weeks post procedure, please call and report this information to us.    If any biopsies were taken you will be contacted by phone or by letter within the next 1-3 weeks.  Please call us at [phone] if you have not heard about the biopsies in 3 weeks.    SIGNATURES/CONFIDENTIALITY: You and/or your care partner have signed paperwork which will be entered into your electronic medical record.  These signatures attest to the fact that that the information above on your After Visit Summary has been reviewed and is understood.  Full responsibility of the confidentiality of this discharge information lies with you and/or your care-partner.  

## 2020-01-19 NOTE — Progress Notes (Signed)
Temp by JB Vitals by CW  Pt's states no medical or surgical changes since previsit or office visit.  

## 2020-01-21 ENCOUNTER — Telehealth: Payer: Self-pay | Admitting: *Deleted

## 2020-01-21 NOTE — Telephone Encounter (Signed)
  Follow up Call-  Call back number 01/19/2020  Post procedure Call Back phone  # [phone]  Permission to leave phone message Yes  Some recent data might be hidden     Patient questions:  Do you have a fever, pain , or abdominal swelling? No. Pain Score  0 *  Have you tolerated food without any problems? Yes.    Have you been able to return to your normal activities? Yes.    Do you have any questions about your discharge instructions: Diet   No. Medications  No. Follow up visit  No.  Do you have questions or concerns about your Care? No.  Actions: * If pain score is 4 or above: No action needed, pain <4.  1. Have you developed a fever since your procedure? no  2.   Have you had an respiratory symptoms (SOB or cough) since your procedure? no  3.   Have you tested positive for COVID 19 since your procedure no  4.   Have you had any family members/close contacts diagnosed with the COVID 19 since your procedure?  no   If yes to any of these questions please route to Joylene John, RN and Erenest Rasher, RN

## 2020-01-25 ENCOUNTER — Ambulatory Visit: Payer: 59 | Attending: Internal Medicine

## 2020-01-25 DIAGNOSIS — Z23 Encounter for immunization: Secondary | ICD-10-CM

## 2020-01-25 NOTE — Progress Notes (Signed)
   Covid-19 Vaccination Clinic  Name:  CARROLYN HIMELRIGHT    MRN: [id number] DOB: 04/19/1971  01/25/2020  Ms. Shugarts was observed post Covid-19 immunization for 15 minutes without incident. She was provided with Vaccine Information Sheet and instruction to access the V-Safe system.   Ms. Holgerson was instructed to call 911 with any severe reactions post vaccine: Marland Kitchen Difficulty breathing  . Swelling of face and throat  . A fast heartbeat  . A bad rash all over body  . Dizziness and weakness   Immunizations Administered    Name Date Dose VIS Date Route   Pfizer COVID-19 Vaccine 01/25/2020 10:47 AM 0.3 mL 11/25/2018 Intramuscular   Manufacturer: [place]   Lot: [id number]   [place]: [id number]

## 2020-02-18 NOTE — Progress Notes (Signed)
[facility] [place], [place] AT [facility] OF [address] [place] [address] Phone: [phone] Fax: [phone]      Your procedure is scheduled on Feb 23, 2020.  Report to [facility] Main Entrance "A" at 05:30 A.M., and check in at the Admitting office.  Call this number if you have problems the morning of surgery:  [phone]  Call [phone] if you have any questions prior to your surgery date Monday-Friday 8am-4pm    Remember:  Do not eat after midnight the night before your surgery  You may drink clear liquids until 04:30am the morning of your surgery.   Clear liquids allowed are: Water, Non-Citrus Juices (without pulp), Carbonated Beverages, Clear Tea, Black Coffee Only, and Gatorade.  **Please complete your PRE-SURGERY ENSURE that was provided by 4:30am the morning of surgery.  Please, if able, drink it in one setting. DO NOT SIP.**    Take these medicines the morning of surgery with A SIP OF WATER : Loratadine (Claritin) Sertraline (Zoloft)  As of today, STOP taking any Aspirin (unless otherwise instructed by your surgeon) and Aspirin containing products, Aleve, Naproxen, Ibuprofen, Motrin, Advil, Goody's, BC's, all herbal medications, fish oil, and all vitamins.                      Do not wear jewelry, make up, or nail polish            Do not wear lotions, powders, perfumes/colognes, or deodorant.            Do not shave 48 hours prior to surgery.             Do not bring valuables to the hospital.            [facility] is not responsible for any belongings or valuables.  Do NOT Smoke (Tobacco/Vapping) or drink Alcohol 24 hours prior to your procedure If you use a CPAP at night, you may bring all equipment for your overnight stay.   Contacts, glasses, dentures or bridgework may not be worn into surgery.      For patients admitted to the hospital, discharge time will be determined by your treatment  team.   Patients discharged the day of surgery will not be allowed to drive home, and someone needs to stay with them for 24 hours.    Special instructions:   [facility]- Preparing For Surgery  Before surgery, you can play an important role. Because skin is not sterile, your skin needs to be as free of germs as possible. You can reduce the number of germs on your skin by washing with CHG (chlorahexidine gluconate) Soap before surgery.  CHG is an antiseptic cleaner which kills germs and bonds with the skin to continue killing germs even after washing.    Oral Hygiene is also important to reduce your risk of infection.  Remember - BRUSH YOUR TEETH THE MORNING OF SURGERY WITH YOUR REGULAR TOOTHPASTE  Please do not use if you have an allergy to CHG or antibacterial soaps. If your skin becomes reddened/irritated stop using the CHG.  Do not shave (including legs and underarms) for at least 48 hours prior to first CHG shower. It is OK to shave your face.  Please follow these instructions carefully.   1. Shower the NIGHT BEFORE SURGERY and the MORNING OF SURGERY with CHG Soap.   2. If you chose to wash  your hair, wash your hair first as usual with your normal shampoo.  3. After you shampoo, rinse your hair and body thoroughly to remove the shampoo.  4. Use CHG as you would any other liquid soap. You can apply CHG directly to the skin and wash gently with a scrungie or a clean washcloth.   5. Apply the CHG Soap to your body ONLY FROM THE NECK DOWN.  Do not use on open wounds or open sores. Avoid contact with your eyes, ears, mouth and genitals (private parts). Wash Face and genitals (private parts)  with your normal soap.   6. Wash thoroughly, paying special attention to the area where your surgery will be performed.  7. Thoroughly rinse your body with warm water from the neck down.  8. DO NOT shower/wash with your normal soap after using and rinsing off the CHG Soap.  9. Pat yourself dry  with a CLEAN TOWEL.  10. Wear CLEAN PAJAMAS to bed the night before surgery, wear comfortable clothes the morning of surgery  11. Place CLEAN SHEETS on your bed the night of your first shower and DO NOT SLEEP WITH PETS.   Day of Surgery:   Do not apply any deodorants/lotions.  Please wear clean clothes to the hospital/surgery center.   Remember to brush your teeth WITH YOUR REGULAR TOOTHPASTE.   Please read over the following fact sheets that you were given.

## 2020-02-19 ENCOUNTER — Other Ambulatory Visit (HOSPITAL_COMMUNITY)
Admission: RE | Admit: 2020-02-19 | Discharge: 2020-02-19 | Disposition: A | Discharge status: Home / Self Care | Payer: 59 | Source: Ambulatory Visit | Attending: General Surgery | Admitting: General Surgery

## 2020-02-19 ENCOUNTER — Encounter (HOSPITAL_COMMUNITY): Payer: Self-pay

## 2020-02-19 ENCOUNTER — Encounter (HOSPITAL_COMMUNITY)
Admission: RE | Admit: 2020-02-19 | Discharge: 2020-02-19 | Disposition: A | Discharge status: Home / Self Care | Payer: 59 | Source: Ambulatory Visit | Attending: General Surgery | Admitting: General Surgery

## 2020-02-19 ENCOUNTER — Other Ambulatory Visit: Payer: Self-pay

## 2020-02-19 DIAGNOSIS — Z20822 Contact with and (suspected) exposure to covid-19: Secondary | ICD-10-CM | POA: Insufficient documentation

## 2020-02-19 DIAGNOSIS — Z01812 Encounter for preprocedural laboratory examination: Secondary | ICD-10-CM | POA: Insufficient documentation

## 2020-02-19 LAB — CBC
HCT: 39.7 % (ref 36.0–46.0)
Hemoglobin: 12.3 g/dL (ref 12.0–15.0)
MCH: 26.8 pg (ref 26.0–34.0)
MCHC: 31 g/dL (ref 30.0–36.0)
MCV: 86.5 fL (ref 80.0–100.0)
Platelets: 269 10*3/uL (ref 150–400)
RBC: 4.59 MIL/uL (ref 3.87–5.11)
RDW: 15.7 % — ABNORMAL HIGH (ref 11.5–15.5)
WBC: 6.6 10*3/uL (ref 4.0–10.5)
nRBC: 0 % (ref 0.0–0.2)

## 2020-02-19 LAB — SARS CORONAVIRUS 2 (TAT 6-24 HRS): SARS Coronavirus 2: NEGATIVE

## 2020-02-19 NOTE — Progress Notes (Signed)
PCP - Roe Coombs, PA Cardiologist - patient denies  PPM/ICD - n/a Device Orders -  Rep Notified -   Chest x-ray - n/a EKG - n/a Stress Test - patient denies ECHO - patient denies Cardiac Cath - patient denies  Sleep Study - patient denies CPAP -   Fasting Blood Sugar - n/a Checks Blood Sugar _____ times a day  Blood Thinner Instructions: n/a Aspirin Instructions: n/a  ERAS Protcol - clears until 0430 PRE-SURGERY Ensure or G2- Ensure ordered, patient did not want.  COVID TEST- 02/19/20 prior to PAT appointment   Anesthesia review: n/a  Patient denies shortness of breath, fever, cough and chest pain at PAT appointment   All instructions explained to the patient, with a verbal understanding of the material. Patient agrees to go over the instructions while at home for a better understanding. Patient also instructed to self quarantine after being tested for COVID-19. The opportunity to ask questions was provided.

## 2020-02-22 NOTE — Anesthesia Preprocedure Evaluation (Addendum)
Anesthesia Evaluation  Patient identified by MRN, date of birth, ID band Patient awake    Reviewed: Allergy & Precautions, NPO status , Patient's Chart, lab work & pertinent test results  History of Anesthesia Complications Negative for: history of anesthetic complications  Airway Mallampati: I  TM Distance: >3 FB Neck ROM: Full    Dental  (+) Dental Advisory Given   Pulmonary neg pulmonary ROS,  02/19/2020 SARS coronavirus NEG   breath sounds clear to auscultation       Cardiovascular negative cardio ROS   Rhythm:Regular Rate:Normal     Neuro/Psych negative neurological ROS     GI/Hepatic negative GI ROS, Neg liver ROS,   Endo/Other  negative endocrine ROS  Renal/GU negative Renal ROS     Musculoskeletal   Abdominal   Peds  Hematology negative hematology ROS (+)   Anesthesia Other Findings   Reproductive/Obstetrics LMP                            Anesthesia Physical Anesthesia Plan  ASA: II  Anesthesia Plan: General   Post-op Pain Management:    Induction: Intravenous  PONV Risk Score and Plan: 3 and Ondansetron, Dexamethasone and Scopolamine patch - Pre-op  Airway Management Planned: LMA  Additional Equipment:   Intra-op Plan:   Post-operative Plan:   Informed Consent: I have reviewed the patients History and Physical, chart, labs and discussed the procedure including the risks, benefits and alternatives for the proposed anesthesia with the patient or authorized representative who has indicated his/her understanding and acceptance.     Dental advisory given  Plan Discussed with: CRNA and Surgeon  Anesthesia Plan Comments:        Anesthesia Quick Evaluation

## 2020-02-23 ENCOUNTER — Encounter (HOSPITAL_COMMUNITY): Payer: Self-pay | Admitting: General Surgery

## 2020-02-23 ENCOUNTER — Other Ambulatory Visit: Payer: Self-pay

## 2020-02-23 ENCOUNTER — Ambulatory Visit (HOSPITAL_COMMUNITY): Payer: 59 | Admitting: Certified Registered Nurse Anesthetist

## 2020-02-23 ENCOUNTER — Ambulatory Visit (HOSPITAL_COMMUNITY)
Admission: RE | Admit: 2020-02-23 | Discharge: 2020-02-23 | Disposition: A | Discharge status: Home / Self Care | Payer: 59 | Attending: General Surgery | Admitting: General Surgery

## 2020-02-23 ENCOUNTER — Encounter (HOSPITAL_COMMUNITY)
Admission: RE | Disposition: A | Discharge status: Home / Self Care | Payer: Self-pay | Source: Home / Self Care | Attending: General Surgery

## 2020-02-23 DIAGNOSIS — Z800 Family history of malignant neoplasm of digestive organs: Secondary | ICD-10-CM | POA: Insufficient documentation

## 2020-02-23 DIAGNOSIS — Z79899 Other long term (current) drug therapy: Secondary | ICD-10-CM | POA: Diagnosis not present

## 2020-02-23 DIAGNOSIS — D649 Anemia, unspecified: Secondary | ICD-10-CM | POA: Insufficient documentation

## 2020-02-23 DIAGNOSIS — Z8371 Family history of colonic polyps: Secondary | ICD-10-CM | POA: Diagnosis not present

## 2020-02-23 DIAGNOSIS — D1724 Benign lipomatous neoplasm of skin and subcutaneous tissue of left leg: Secondary | ICD-10-CM | POA: Insufficient documentation

## 2020-02-23 HISTORY — PX: LIPOMA EXCISION: SHX5283

## 2020-02-23 LAB — POCT PREGNANCY, URINE: Preg Test, Ur: NEGATIVE

## 2020-02-23 SURGERY — EXCISION LIPOMA
Anesthesia: General | Site: Thigh | Laterality: Left

## 2020-02-23 MED ORDER — GLYCOPYRROLATE PF 0.2 MG/ML IJ SOSY
PREFILLED_SYRINGE | INTRAMUSCULAR | Status: DC | PRN
  Administered 2020-02-23: .1 mg via INTRAVENOUS

## 2020-02-23 MED ORDER — MIDAZOLAM HCL 5 MG/5ML IJ SOLN
INTRAMUSCULAR | Status: DC | PRN
  Administered 2020-02-23: 2 mg via INTRAVENOUS

## 2020-02-23 MED ORDER — PROPOFOL 10 MG/ML IV BOLUS
INTRAVENOUS | Status: DC | PRN
  Administered 2020-02-23: 200 mg via INTRAVENOUS

## 2020-02-23 MED ORDER — PHENYLEPHRINE 40 MCG/ML (10ML) SYRINGE FOR IV PUSH (FOR BLOOD PRESSURE SUPPORT)
PREFILLED_SYRINGE | INTRAVENOUS | Status: AC
  Filled 2020-02-23: qty 10

## 2020-02-23 MED ORDER — SCOPOLAMINE 1 MG/3DAYS TD PT72
1.0000 | MEDICATED_PATCH | Freq: Once | TRANSDERMAL | Status: DC
  Administered 2020-02-23: 1.5 mg via TRANSDERMAL
  Filled 2020-02-23: qty 1

## 2020-02-23 MED ORDER — CEFAZOLIN SODIUM-DEXTROSE 2-4 GM/100ML-% IV SOLN
2.0000 g | INTRAVENOUS | Status: AC
  Administered 2020-02-23: 2 g via INTRAVENOUS
  Filled 2020-02-23: qty 100

## 2020-02-23 MED ORDER — DEXAMETHASONE SODIUM PHOSPHATE 10 MG/ML IJ SOLN
INTRAMUSCULAR | Status: AC
  Filled 2020-02-23: qty 1

## 2020-02-23 MED ORDER — ONDANSETRON HCL 4 MG/2ML IJ SOLN
INTRAMUSCULAR | Status: DC | PRN
  Administered 2020-02-23: 4 mg via INTRAVENOUS

## 2020-02-23 MED ORDER — FENTANYL CITRATE (PF) 100 MCG/2ML IJ SOLN
INTRAMUSCULAR | Status: DC | PRN
  Administered 2020-02-23 (×2): 50 ug via INTRAVENOUS

## 2020-02-23 MED ORDER — ENSURE PRE-SURGERY PO LIQD: 296.0000 mL | Freq: Once | ORAL | Status: DC

## 2020-02-23 MED ORDER — LIDOCAINE 2% (20 MG/ML) 5 ML SYRINGE
INTRAMUSCULAR | Status: DC | PRN
  Administered 2020-02-23: 30 mg via INTRAVENOUS

## 2020-02-23 MED ORDER — CHLORHEXIDINE GLUCONATE 0.12 % MT SOLN
15.0000 mL | Freq: Once | OROMUCOSAL | Status: AC
  Administered 2020-02-23: 15 mL via OROMUCOSAL
  Filled 2020-02-23: qty 15

## 2020-02-23 MED ORDER — ACETAMINOPHEN 500 MG PO TABS
1000.0000 mg | ORAL_TABLET | Freq: Once | ORAL | Status: AC
  Administered 2020-02-23: 1000 mg via ORAL
  Filled 2020-02-23: qty 2

## 2020-02-23 MED ORDER — PROPOFOL 10 MG/ML IV BOLUS
INTRAVENOUS | Status: AC
  Filled 2020-02-23: qty 20

## 2020-02-23 MED ORDER — BUPIVACAINE HCL (PF) 0.25 % IJ SOLN
INTRAMUSCULAR | Status: AC
  Filled 2020-02-23: qty 30

## 2020-02-23 MED ORDER — KETOROLAC TROMETHAMINE 15 MG/ML IJ SOLN
15.0000 mg | INTRAMUSCULAR | Status: AC
  Administered 2020-02-23: 15 mg via INTRAVENOUS
  Filled 2020-02-23: qty 1

## 2020-02-23 MED ORDER — LIDOCAINE 2% (20 MG/ML) 5 ML SYRINGE
INTRAMUSCULAR | Status: AC
  Filled 2020-02-23: qty 5

## 2020-02-23 MED ORDER — GABAPENTIN 100 MG PO CAPS
100.0000 mg | ORAL_CAPSULE | ORAL | Status: AC
  Administered 2020-02-23: 100 mg via ORAL
  Filled 2020-02-23: qty 1

## 2020-02-23 MED ORDER — BUPIVACAINE HCL (PF) 0.25 % IJ SOLN
INTRAMUSCULAR | Status: DC | PRN
  Administered 2020-02-23: 6 mL

## 2020-02-23 MED ORDER — 0.9 % SODIUM CHLORIDE (POUR BTL) OPTIME
TOPICAL | Status: DC | PRN
  Administered 2020-02-23: 1000 mL

## 2020-02-23 MED ORDER — FENTANYL CITRATE (PF) 250 MCG/5ML IJ SOLN
INTRAMUSCULAR | Status: AC
  Filled 2020-02-23: qty 5

## 2020-02-23 MED ORDER — LACTATED RINGERS IV SOLN: INTRAVENOUS | Status: DC | PRN

## 2020-02-23 MED ORDER — GLYCOPYRROLATE PF 0.2 MG/ML IJ SOSY
PREFILLED_SYRINGE | INTRAMUSCULAR | Status: AC
  Filled 2020-02-23: qty 1

## 2020-02-23 MED ORDER — PROPOFOL 1000 MG/100ML IV EMUL
INTRAVENOUS | Status: AC
  Filled 2020-02-23: qty 100

## 2020-02-23 MED ORDER — ONDANSETRON HCL 4 MG/2ML IJ SOLN
INTRAMUSCULAR | Status: AC
  Filled 2020-02-23: qty 2

## 2020-02-23 MED ORDER — DEXAMETHASONE SODIUM PHOSPHATE 4 MG/ML IJ SOLN
INTRAMUSCULAR | Status: DC | PRN
  Administered 2020-02-23: 6 mg via INTRAVENOUS

## 2020-02-23 MED ORDER — MIDAZOLAM HCL 2 MG/2ML IJ SOLN
INTRAMUSCULAR | Status: AC
  Filled 2020-02-23: qty 2

## 2020-02-23 MED ORDER — ORAL CARE MOUTH RINSE: 15.0000 mL | Freq: Once | OROMUCOSAL | Status: AC

## 2020-02-23 SURGICAL SUPPLY — 40 items
ADH SKN CLS APL DERMABOND .7 (GAUZE/BANDAGES/DRESSINGS) ×1
APL PRP STRL LF DISP 70% ISPRP (MISCELLANEOUS) ×1
BLADE CLIPPER SURG (BLADE) IMPLANT
CHLORAPREP W/TINT 26 (MISCELLANEOUS) ×3 IMPLANT
CLOSURE WOUND 1/2 X4 (GAUZE/BANDAGES/DRESSINGS) ×1
COVER SURGICAL LIGHT HANDLE (MISCELLANEOUS) ×3 IMPLANT
COVER WAND RF STERILE (DRAPES) ×3 IMPLANT
DECANTER SPIKE VIAL GLASS SM (MISCELLANEOUS) ×3 IMPLANT
DERMABOND ADVANCED (GAUZE/BANDAGES/DRESSINGS) ×2
DERMABOND ADVANCED .7 DNX12 (GAUZE/BANDAGES/DRESSINGS) ×1 IMPLANT
DRAPE LAPAROTOMY 100X72 PEDS (DRAPES) ×3 IMPLANT
ELECT CAUTERY BLADE 6.4 (BLADE) ×1 IMPLANT
ELECT REM PT RETURN 9FT ADLT (ELECTROSURGICAL) ×3
ELECTRODE REM PT RTRN 9FT ADLT (ELECTROSURGICAL) ×1 IMPLANT
GAUZE SPONGE 4X4 12PLY STRL (GAUZE/BANDAGES/DRESSINGS) ×2 IMPLANT
GLOVE BIO SURGEON STRL SZ7 (GLOVE) ×3 IMPLANT
GLOVE BIOGEL PI IND STRL 7.5 (GLOVE) ×1 IMPLANT
GLOVE BIOGEL PI INDICATOR 7.5 (GLOVE) ×2
GOWN STRL REUS W/ TWL LRG LVL3 (GOWN DISPOSABLE) ×2 IMPLANT
GOWN STRL REUS W/TWL LRG LVL3 (GOWN DISPOSABLE) ×6
KIT BASIN OR (CUSTOM PROCEDURE TRAY) ×3 IMPLANT
KIT TURNOVER KIT B (KITS) ×3 IMPLANT
NDL HYPO 25GX1X1/2 BEV (NEEDLE) ×1 IMPLANT
NEEDLE HYPO 25GX1X1/2 BEV (NEEDLE) ×3 IMPLANT
NS IRRIG 1000ML POUR BTL (IV SOLUTION) ×3 IMPLANT
PACK GENERAL/GYN (CUSTOM PROCEDURE TRAY) ×3 IMPLANT
PAD ARMBOARD 7.5X6 YLW CONV (MISCELLANEOUS) ×6 IMPLANT
PENCIL SMOKE EVACUATOR (MISCELLANEOUS) ×3 IMPLANT
SPECIMEN JAR SMALL (MISCELLANEOUS) IMPLANT
STRIP CLOSURE SKIN 1/2X4 (GAUZE/BANDAGES/DRESSINGS) ×1 IMPLANT
SUT MNCRL AB 4-0 PS2 18 (SUTURE) ×3 IMPLANT
SUT VIC AB 2-0 SH 27 (SUTURE) ×3
SUT VIC AB 2-0 SH 27XBRD (SUTURE) IMPLANT
SUT VIC AB 3-0 SH 27 (SUTURE) ×6
SUT VIC AB 3-0 SH 27XBRD (SUTURE) ×1 IMPLANT
SUT VICRYL 4-0 PS2 18IN ABS (SUTURE) ×3 IMPLANT
SUT VICRYL AB 2 0 TIES (SUTURE) ×3 IMPLANT
SYR CONTROL 10ML LL (SYRINGE) ×3 IMPLANT
TOWEL GREEN STERILE (TOWEL DISPOSABLE) ×3 IMPLANT
TOWEL GREEN STERILE FF (TOWEL DISPOSABLE) ×3 IMPLANT

## 2020-02-23 NOTE — Anesthesia Postprocedure Evaluation (Signed)
Anesthesia Post Note  Patient: Cynthia Irwin  Procedure(s) Performed: LEFT INNER THIGH LIPOMA EXCISION (Left Thigh)     Patient location during evaluation: PACU Anesthesia Type: General Level of consciousness: awake and alert, oriented and patient cooperative Pain management: pain level controlled Vital Signs Assessment: post-procedure vital signs reviewed and stable Respiratory status: spontaneous breathing, nonlabored ventilation and respiratory function stable Cardiovascular status: blood pressure returned to baseline and stable Postop Assessment: no apparent nausea or vomiting Anesthetic complications: no    Last Vitals:  Vitals:   02/23/20 0842 02/23/20 0845  BP: (!) 95/54   Pulse: 83   Resp: 20   Temp:  36.7 C  SpO2: 100%     Last Pain:  Vitals:   02/23/20 0845  TempSrc:   PainSc: 0-No pain                 Lan Entsminger,E. Shawna Kiener

## 2020-02-23 NOTE — Transfer of Care (Signed)
Immediate Anesthesia Transfer of Care Note  Patient: Cynthia Irwin  Procedure(s) Performed: LEFT INNER THIGH LIPOMA EXCISION (Left Thigh)  Patient Location: PACU  Anesthesia Type:General  Level of Consciousness: drowsy  Airway & Oxygen Therapy: Patient Spontanous Breathing and Patient connected to face mask oxygen  Post-op Assessment: Report given to RN and Post -op Vital signs reviewed and stable  Post vital signs: Reviewed and stable  Last Vitals:  Vitals Value Taken Time  BP 107/63 02/23/20 0823  Temp    Pulse 74 02/23/20 0823  Resp 16 02/23/20 0823  SpO2 100 % 02/23/20 0823  Vitals shown include unvalidated device data.  Last Pain:  Vitals:   02/23/20 0645  TempSrc:   PainSc: 0-No pain      Patients Stated Pain Goal: 3 ([id number])  Complications: No apparent anesthesia complications

## 2020-02-23 NOTE — Discharge Instructions (Signed)
[facility] IN[facility]  Always review your discharge instruction sheet given to you by the facility where your surgery was performed. IF YOU HAVE DISABILITY OR FAMILY LEAVE FORMS, YOU MUST BRING THEM TO THE OFFICE FOR PROCESSING.   DO NOT GIVE THEM TO YOUR DOCTOR.  1. A  prescription for pain medication may be given to you upon discharge.  Take your pain medication as prescribed, if needed.  If narcotic pain medicine is not needed, then you may take acetaminophen (Tylenol), naprosyn (Alleve) or ibuprofen (Advil) as needed. 2. Take your usually prescribed medications unless otherwise directed. 3. If you need a refill on your pain medication, please contact your pharmacy.  They will contact our office to request authorization. Prescriptions will not be filled after 5 pm or on week-ends. 4. You should follow a light diet the first 24 hours after arrival home, such as soup and crackers, etc.  Be sure to include lots of fluids daily.  Resume your normal diet the day after surgery. 5. Most patients will experience some swelling and bruising at the incision.  Ice packs and reclining will help.  Swelling and bruising can take several days to resolve.  6. It is common to experience some constipation if taking pain medication after surgery.  Increasing fluid intake and taking a stool softener (such as Colace) will usually help or prevent this problem from occurring.  A mild laxative (Milk of Magnesia or Miralax) should be taken according to package directions if there are no bowel movements after 48 hours. 7. You have steri-strips (small skin tapes) in place directly over the incision.  These strips should be left on the skin for 7-10 days and will come off on their own.  If your surgeon used skin glue on the incision, you may shower in 24 hours.  The glue will flake off over the next 2 weeks.   8. ACTIVITIES:  You may resume regular (light) daily activities beginning the next  day--such as daily self-care, walking, climbing stairs--gradually increasing activities as tolerated.   a. You may drive when you are no longer taking prescription pain medication, you can comfortably wear a seatbelt, and you can safely maneuver your car and apply brakes. b. RETURN TO WORK:  __________________________________________________________ 9. You should see your doctor in the office for a follow-up appointment approximately 2-3 weeks after your surgery.  Make sure that you call for this appointment within a day or two after you arrive home to insure a convenient appointment time. 10. OTHER INSTRUCTIONS:  __________________________________________________________________________________________________________________________________________________________________________________________  WHEN TO CALL YOUR DOCTOR: 1. Fever over 101.0 2. Inability to urinate 3. Nausea and/or vomiting 4. Extreme swelling or bruising 5. Continued bleeding from incision. 6. Increased pain, redness, or drainage from the incision  The clinic staff is available to answer your questions during regular business hours.  Please don't hesitate to call and ask to speak to one of the nurses for clinical concerns.  If you have a medical emergency, go to the nearest emergency room or call 911.  A surgeon from [facility] is always on call at the hospital   [address], [place], [place], [place]  [address] ?  [address], [place], [place]   [address] [phone] ? [phone] ? FAX [phone] Web site: [email or web address]

## 2020-02-23 NOTE — H&P (Signed)
Cynthia Irwin is an 49 y.o. female.   Chief Complaint: thigh mass HPI: 49 yo healthy female I know from a prior surgery for breast biopsy. she has noted a perineal mass for some time that is getting bigger, causing discomfort as well. she was told previously this was a lipoma and observed but due to growth and concern she presents today. her husband passed away last year as well.  Past Medical History:  Diagnosis Date  . Allergy    seasonal  . Anemia    mild, per pt. - no current med.  . Breast mass, right 01/2017  . Dental crowns present     Past Surgical History:  Procedure Laterality Date  . BREAST BIOPSY Right    2018, phyllodes tumor  . BREAST BIOPSY Right    2014- benign  . 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 BREAST EXCISIONAL BIOPSY Right    2018  . BREAST LUMPECTOMY Right 2018   benign tumor, no lymph nodes taken  . CESAREAN SECTION     x 2  . RADIOACTIVE SEED GUIDED EXCISIONAL BREAST BIOPSY Right 02/06/2017   Procedure: RADIOACTIVE SEED GUIDED EXCISIONAL BREAST BIOPSY;  Surgeon: Rolm Bookbinder, MD;  Location: [facility];  Service: General;  Laterality: Right;    Family History  Problem Relation Age of Onset  . Colon polyps Mother   . Colon polyps Maternal Aunt   . Colon polyps Maternal Uncle   . Colon cancer Maternal Grandfather   . Colon polyps Maternal Grandfather   . Colon cancer Other   . Esophageal cancer Neg Hx   . Rectal cancer Neg Hx   . Stomach cancer Neg Hx    Social History:  reports that she has never smoked. She has never used smokeless tobacco. She reports current alcohol use. She reports that she does not use drugs.  Allergies: No Known Allergies  Medications Prior to Admission  Medication Sig Dispense Refill  . calamine lotion Apply 1 application topically as needed for itching.    . hydrocortisone cream 0.5 % Apply 1 application topically as needed for itching.    . loratadine (CLARITIN) 10 MG tablet Take 10  mg by mouth daily.     . sertraline (ZOLOFT) 50 MG tablet Take 50 mg by mouth daily.      Results for orders placed or performed during the hospital encounter of 02/23/20 (from the past 48 hour(s))  Pregnancy, urine POC     Status: None   Collection Time: 02/23/20  6:35 AM  Result Value Ref Range   Preg Test, Ur NEGATIVE NEGATIVE    Comment:        THE SENSITIVITY OF THIS METHODOLOGY IS >24 mIU/mL    No results found.  Review of Systems  All other systems reviewed and are negative.   Blood pressure 118/69, pulse 77, temperature 98.4 F (36.9 C), temperature source Oral, resp. rate 17, height 5\' 4"  (1.626 m), weight 70.3 kg, SpO2 99 %. 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 will check ct pelvis to ensure nothing more. if not will proceed to excision as discussed today. discussed postop recovery and risks.  Rolm Bookbinder, MD 02/23/2020, 7:17 AM

## 2020-02-23 NOTE — Anesthesia Procedure Notes (Signed)
Procedure Name: LMA Insertion Date/Time: 02/23/2020 7:34 AM Performed by: Orlie Dakin, CRNA Pre-anesthesia Checklist: Patient identified, Emergency Drugs available, Suction available and Patient being monitored Patient Re-evaluated:Patient Re-evaluated prior to induction Oxygen Delivery Method: Circle system utilized Preoxygenation: Pre-oxygenation with 100% oxygen Induction Type: IV induction Ventilation: Mask ventilation without difficulty LMA: LMA inserted LMA Size: 4.0 Tube type: Oral Number of attempts: 1 Placement Confirmation: positive ETCO2 Tube secured with: Tape

## 2020-02-23 NOTE — Op Note (Signed)
Preoperative diagnosis: Left perineal and thigh lipoma Postoperative diagnosis: Same as above Procedure: Excision of subcutaneous 7 x 5 cm left perineal and thigh lipoma Surgeon: Dr. Serita Grammes Anesthesia: General Estimated blood loss: Minimal Complications: None Drains: None Specimens: Lipoma to pathology Sponge and count was correct completion Disposition to recovery stable condition  Indications:48 yo healthy female I know from a prior surgery for breast biopsy. she has noted a perineal mass for some time that is getting bigger, causing discomfort as well. she was told previously this was a lipoma and observed but due to growth and concern she presents today.   Procedure: After informed consent was obtained the patient was taken to the operating room.  She was given antibiotics.  SCDs were in place.  She was placed on general anesthesia without complication.  She was placed in lithotomy position and appropriately padded.  Her legs were in good position.  She was then prepped and draped in the standard sterile surgical fashion.  A surgical timeout was then performed.  I made an elliptical incision after infiltrating Marcaine overlying the large left perineal and thigh lipoma.  I then remove the paddle of skin overlying it.  I then remove the entire lipoma which tracked down to the fascia of the thigh.  This measured 7 x 5 cm.  I passed this off the table for pathology.  I then obtained hemostasis.  I closed down the space with 2-0 Vicryl suture.  I then closed the dermis with 3-0 Vicryl and 4-0 Monocryl I did place a small amount of glue overlying the incision a couple of Steri-Strips.  She tolerated this well.  Dressing was placed.  She was extubated and transferred to recovery in stable condition.

## 2020-02-24 LAB — SURGICAL PATHOLOGY

## 2020-04-22 ENCOUNTER — Other Ambulatory Visit: Payer: Self-pay | Admitting: Physician Assistant

## 2020-04-22 DIAGNOSIS — Z1231 Encounter for screening mammogram for malignant neoplasm of breast: Secondary | ICD-10-CM

## 2020-04-29 ENCOUNTER — Other Ambulatory Visit: Payer: Self-pay

## 2020-04-29 ENCOUNTER — Ambulatory Visit
Admission: RE | Admit: 2020-04-29 | Discharge: 2020-04-29 | Disposition: A | Discharge status: Home / Self Care | Payer: 59 | Source: Ambulatory Visit | Attending: Physician Assistant | Admitting: Physician Assistant

## 2020-04-29 DIAGNOSIS — Z1231 Encounter for screening mammogram for malignant neoplasm of breast: Secondary | ICD-10-CM

## 2021-04-12 ENCOUNTER — Other Ambulatory Visit: Payer: Self-pay | Admitting: Physician Assistant

## 2021-04-12 DIAGNOSIS — Z1231 Encounter for screening mammogram for malignant neoplasm of breast: Secondary | ICD-10-CM

## 2021-06-01 ENCOUNTER — Ambulatory Visit: Payer: 59

## 2021-06-09 ENCOUNTER — Ambulatory Visit: Payer: 59

## 2021-07-07 ENCOUNTER — Other Ambulatory Visit: Payer: Self-pay

## 2021-07-07 ENCOUNTER — Ambulatory Visit
Admission: RE | Admit: 2021-07-07 | Discharge: 2021-07-07 | Disposition: A | Discharge status: Home / Self Care | Payer: 59 | Source: Ambulatory Visit | Attending: Physician Assistant | Admitting: Physician Assistant

## 2021-07-07 DIAGNOSIS — Z1231 Encounter for screening mammogram for malignant neoplasm of breast: Secondary | ICD-10-CM

## 2022-04-26 DIAGNOSIS — D485 Neoplasm of uncertain behavior of skin: Secondary | ICD-10-CM | POA: Diagnosis not present

## 2022-04-26 DIAGNOSIS — D2272 Melanocytic nevi of left lower limb, including hip: Secondary | ICD-10-CM | POA: Diagnosis not present

## 2022-04-26 DIAGNOSIS — L813 Cafe au lait spots: Secondary | ICD-10-CM | POA: Diagnosis not present

## 2022-04-26 DIAGNOSIS — L821 Other seborrheic keratosis: Secondary | ICD-10-CM | POA: Diagnosis not present

## 2022-04-26 DIAGNOSIS — L570 Actinic keratosis: Secondary | ICD-10-CM | POA: Diagnosis not present

## 2022-04-26 DIAGNOSIS — D1801 Hemangioma of skin and subcutaneous tissue: Secondary | ICD-10-CM | POA: Diagnosis not present

## 2022-04-26 DIAGNOSIS — D225 Melanocytic nevi of trunk: Secondary | ICD-10-CM | POA: Diagnosis not present

## 2022-04-26 DIAGNOSIS — L814 Other melanin hyperpigmentation: Secondary | ICD-10-CM | POA: Diagnosis not present

## 2022-06-11 ENCOUNTER — Other Ambulatory Visit: Payer: Self-pay | Admitting: Internal Medicine

## 2022-06-11 DIAGNOSIS — Z1231 Encounter for screening mammogram for malignant neoplasm of breast: Secondary | ICD-10-CM

## 2022-06-15 IMAGING — MG MM DIGITAL SCREENING BILAT W/ TOMO AND CAD
8 series · 8 of 24 positions shown · non-contrast
Comparison: Previous exam(s).

CLINICAL DATA: Screening.

EXAM:
DIGITAL SCREENING BILATERAL MAMMOGRAM WITH TOMOSYNTHESIS AND CAD
TECHNIQUE: Bilateral screening digital craniocaudal and mediolateral oblique
mammograms were obtained. Bilateral screening digital breast
tomosynthesis was performed. The images were evaluated with
computer-aided detection.

[L CC synth-2D]
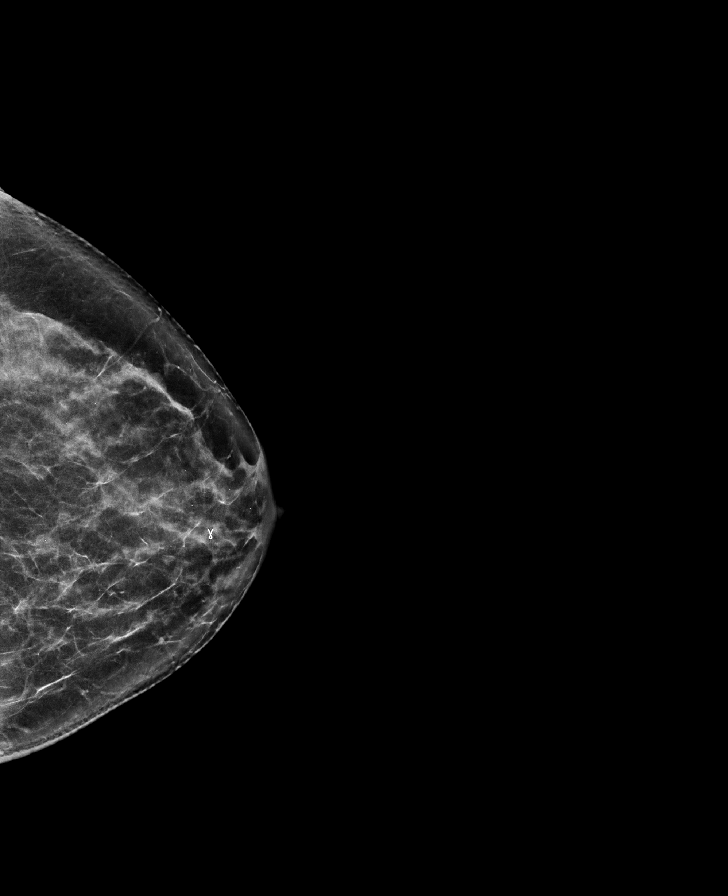

[R MLO synth-2D]
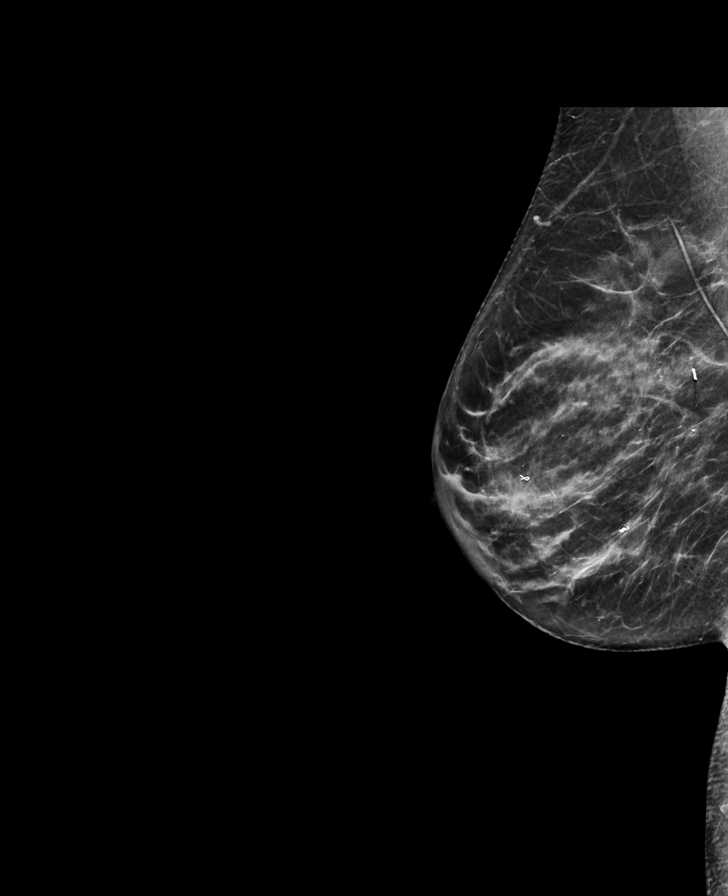

[R CC synth-2D]
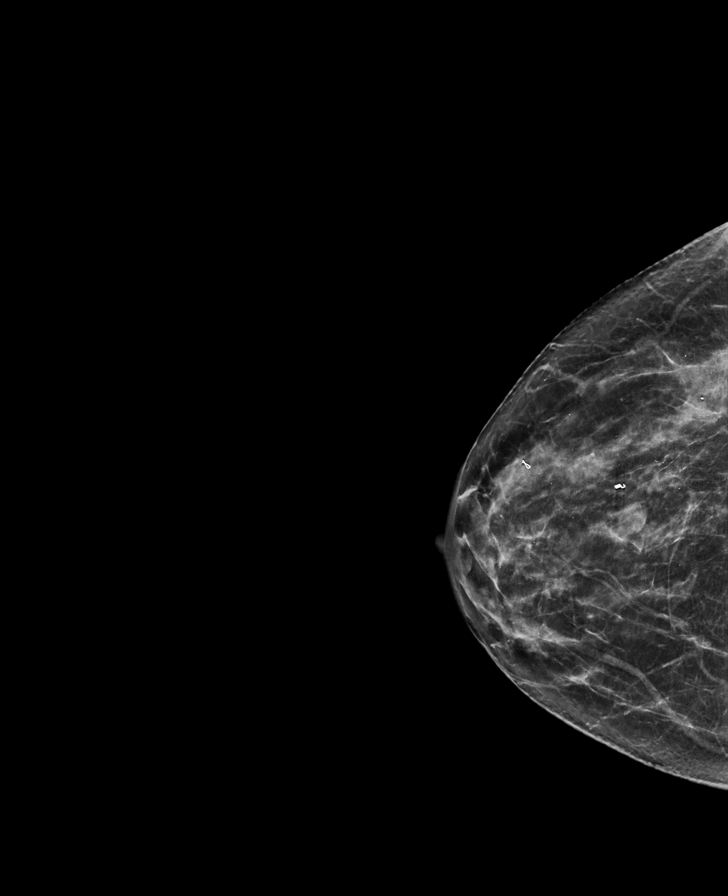

[L MLO synth-2D]
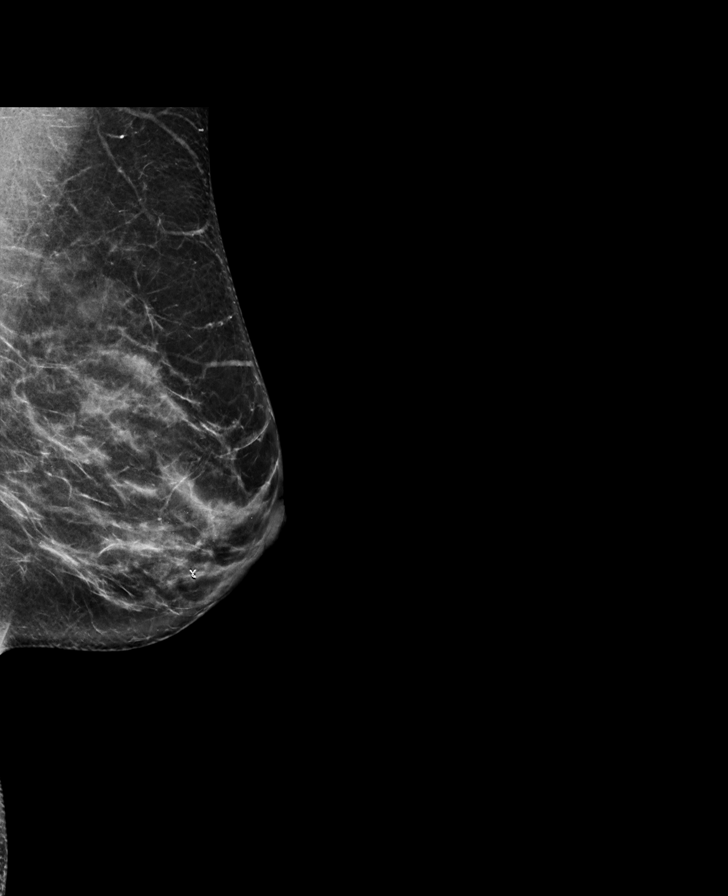

[L MLO tomo · tomo slice 35/70.0]
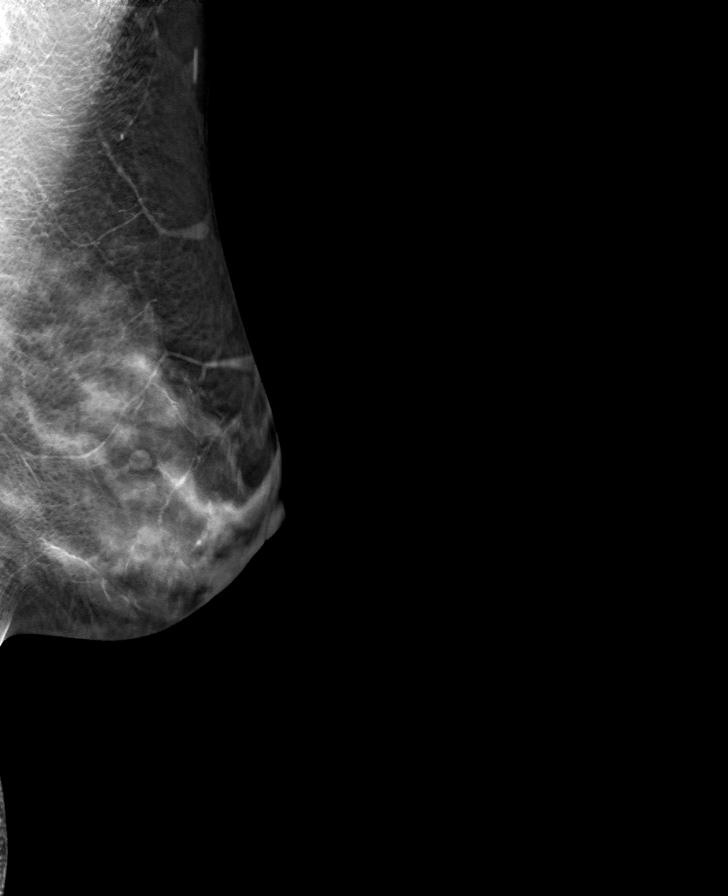

[R MLO tomo · tomo slice 33/66.0]
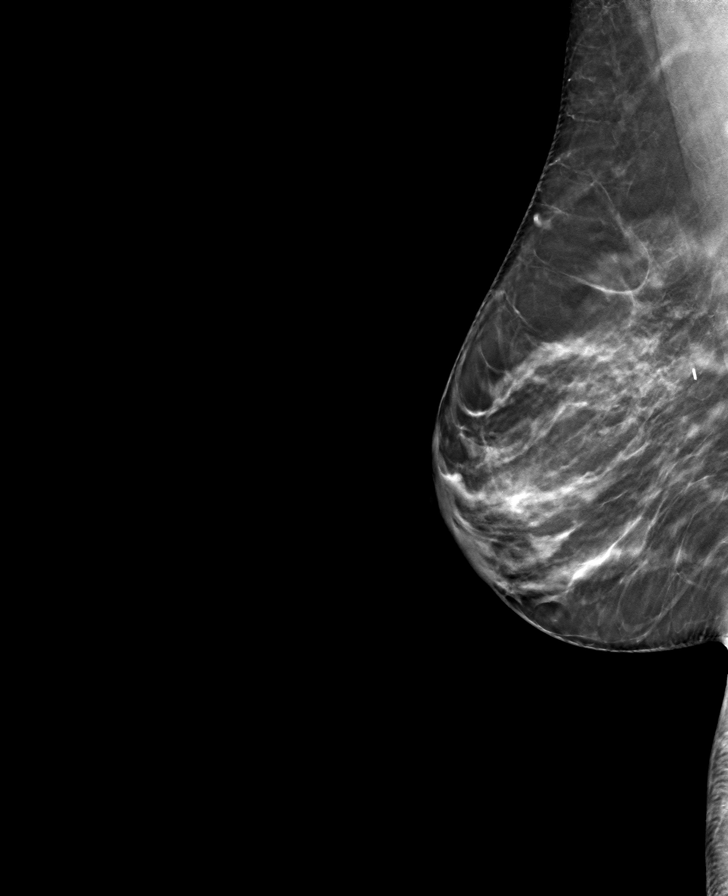

[L CC tomo · tomo slice 37/72.0]
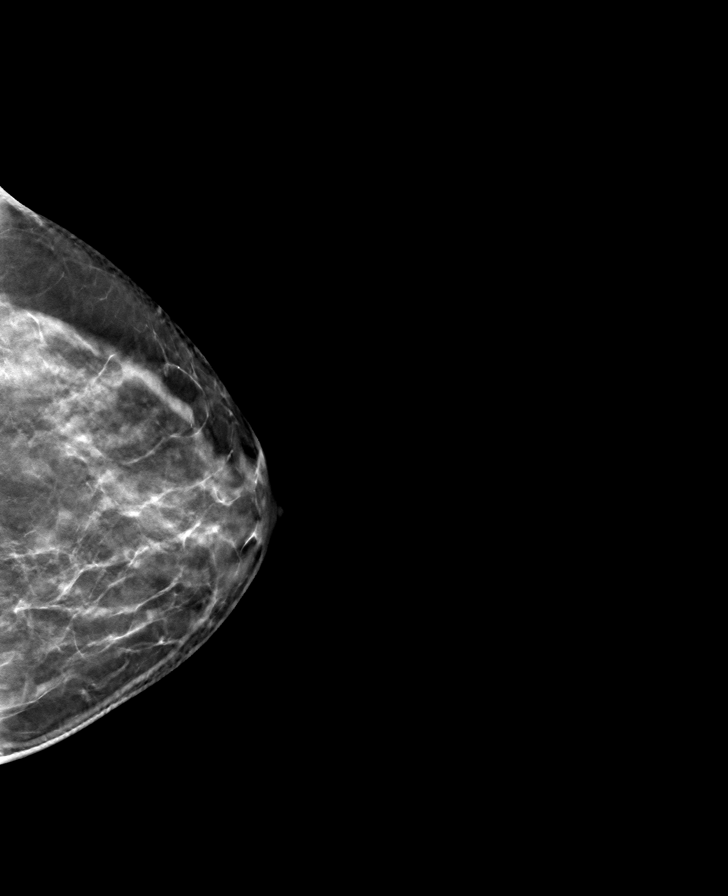

[R CC tomo · tomo slice 32/63.0]
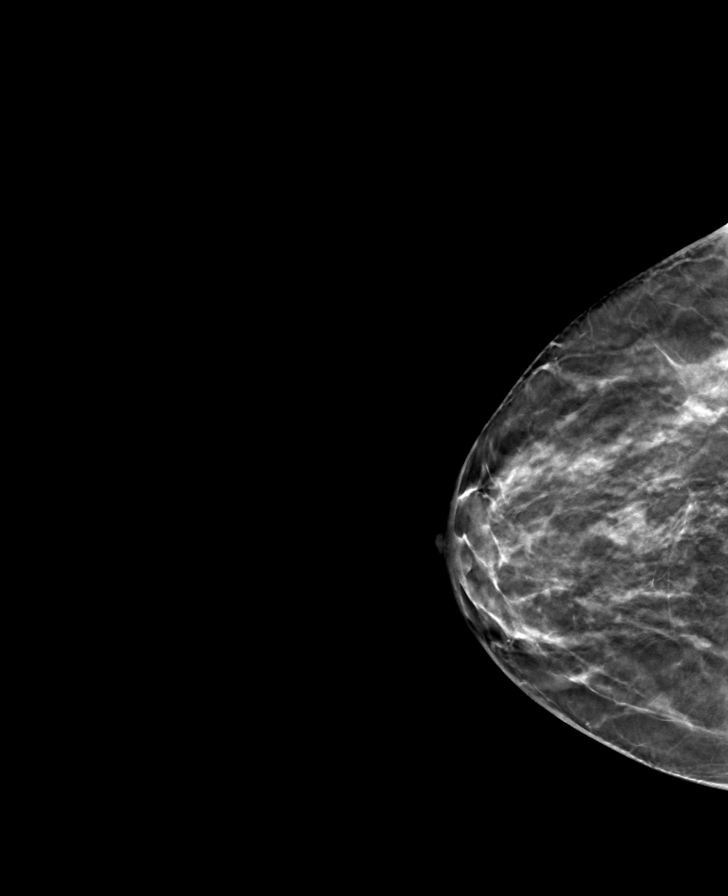

[8 of 24 positions shown; findings below may reference images not displayed]

ACR Breast Density Category c: The breast tissue is heterogeneously
dense, which may obscure small masses.
FINDINGS: There are no findings suspicious for malignancy.
IMPRESSION: No mammographic evidence of malignancy. A result letter of this
screening mammogram will be mailed directly to the patient.

RECOMMENDATION:
Screening mammogram in one year. (Code:[id number]-W-BC3)

BI-RADS CATEGORY  1: Negative.

## 2022-06-19 DIAGNOSIS — R232 Flushing: Secondary | ICD-10-CM | POA: Diagnosis not present

## 2022-06-19 DIAGNOSIS — N951 Menopausal and female climacteric states: Secondary | ICD-10-CM | POA: Diagnosis not present

## 2022-07-09 ENCOUNTER — Ambulatory Visit
Admission: RE | Admit: 2022-07-09 | Discharge: 2022-07-09 | Disposition: A | Discharge status: Home / Self Care | Payer: 59 | Source: Ambulatory Visit | Attending: Internal Medicine | Admitting: Internal Medicine

## 2022-07-09 DIAGNOSIS — Z1231 Encounter for screening mammogram for malignant neoplasm of breast: Secondary | ICD-10-CM | POA: Diagnosis not present

## 2023-04-09 DIAGNOSIS — D225 Melanocytic nevi of trunk: Secondary | ICD-10-CM | POA: Diagnosis not present

## 2023-04-09 DIAGNOSIS — L814 Other melanin hyperpigmentation: Secondary | ICD-10-CM | POA: Diagnosis not present

## 2023-04-09 DIAGNOSIS — D1801 Hemangioma of skin and subcutaneous tissue: Secondary | ICD-10-CM | POA: Diagnosis not present

## 2023-04-09 DIAGNOSIS — L813 Cafe au lait spots: Secondary | ICD-10-CM | POA: Diagnosis not present

## 2023-04-09 DIAGNOSIS — L905 Scar conditions and fibrosis of skin: Secondary | ICD-10-CM | POA: Diagnosis not present

## 2023-04-09 DIAGNOSIS — D2272 Melanocytic nevi of left lower limb, including hip: Secondary | ICD-10-CM | POA: Diagnosis not present

## 2023-04-09 DIAGNOSIS — D235 Other benign neoplasm of skin of trunk: Secondary | ICD-10-CM | POA: Diagnosis not present

## 2023-04-09 DIAGNOSIS — L821 Other seborrheic keratosis: Secondary | ICD-10-CM | POA: Diagnosis not present

## 2023-05-18 DIAGNOSIS — H11422 Conjunctival edema, left eye: Secondary | ICD-10-CM | POA: Diagnosis not present

## 2023-05-18 DIAGNOSIS — H11432 Conjunctival hyperemia, left eye: Secondary | ICD-10-CM | POA: Diagnosis not present

## 2023-06-17 ENCOUNTER — Other Ambulatory Visit: Payer: Self-pay | Admitting: Internal Medicine

## 2023-06-17 DIAGNOSIS — Z1231 Encounter for screening mammogram for malignant neoplasm of breast: Secondary | ICD-10-CM

## 2023-06-21 DIAGNOSIS — R7989 Other specified abnormal findings of blood chemistry: Secondary | ICD-10-CM | POA: Diagnosis not present

## 2023-06-21 DIAGNOSIS — E611 Iron deficiency: Secondary | ICD-10-CM | POA: Diagnosis not present

## 2023-07-02 DIAGNOSIS — Z0000 Encounter for general adult medical examination without abnormal findings: Secondary | ICD-10-CM | POA: Diagnosis not present

## 2023-07-02 DIAGNOSIS — Z23 Encounter for immunization: Secondary | ICD-10-CM | POA: Diagnosis not present

## 2023-07-02 DIAGNOSIS — D649 Anemia, unspecified: Secondary | ICD-10-CM | POA: Diagnosis not present

## 2023-07-02 DIAGNOSIS — Z1339 Encounter for screening examination for other mental health and behavioral disorders: Secondary | ICD-10-CM | POA: Diagnosis not present

## 2023-07-02 DIAGNOSIS — Z6824 Body mass index (BMI) 24.0-24.9, adult: Secondary | ICD-10-CM | POA: Diagnosis not present

## 2023-07-02 DIAGNOSIS — Z1331 Encounter for screening for depression: Secondary | ICD-10-CM | POA: Diagnosis not present

## 2023-07-12 ENCOUNTER — Ambulatory Visit
Admission: RE | Admit: 2023-07-12 | Discharge: 2023-07-12 | Disposition: A | Discharge status: Home / Self Care | Payer: 59 | Source: Ambulatory Visit | Attending: Internal Medicine | Admitting: Internal Medicine

## 2023-07-12 DIAGNOSIS — Z1231 Encounter for screening mammogram for malignant neoplasm of breast: Secondary | ICD-10-CM | POA: Diagnosis not present
